# Patient Record
Sex: Female | Born: 1950 | Race: White | Hispanic: No | Smoking: Current every day smoker
Health system: Southern US, Community
[De-identification: ages and names within clinical notes are randomized; demographics above are authoritative.]

## PROBLEM LIST (undated history)

## (undated) DIAGNOSIS — I10 Essential (primary) hypertension: Secondary | ICD-10-CM

## (undated) DIAGNOSIS — E7800 Pure hypercholesterolemia, unspecified: Secondary | ICD-10-CM

## (undated) HISTORY — PX: AORTOGRAM: SHX6300

---

## 2003-01-26 ENCOUNTER — Emergency Department (HOSPITAL_COMMUNITY): Admission: EM | Admit: 2003-01-26 | Discharge: 2003-01-26 | Payer: Self-pay

## 2003-01-28 ENCOUNTER — Emergency Department (HOSPITAL_COMMUNITY): Admission: EM | Admit: 2003-01-28 | Discharge: 2003-01-28 | Payer: Self-pay | Admitting: Emergency Medicine

## 2003-02-25 ENCOUNTER — Emergency Department (HOSPITAL_COMMUNITY): Admission: EM | Admit: 2003-02-25 | Discharge: 2003-02-25 | Payer: Self-pay | Admitting: Emergency Medicine

## 2003-03-22 ENCOUNTER — Emergency Department (HOSPITAL_COMMUNITY): Admission: EM | Admit: 2003-03-22 | Discharge: 2003-03-22 | Payer: Self-pay | Admitting: Emergency Medicine

## 2003-03-31 ENCOUNTER — Encounter: Admission: RE | Admit: 2003-03-31 | Discharge: 2003-03-31 | Payer: Self-pay | Admitting: Neurological Surgery

## 2010-01-03 ENCOUNTER — Emergency Department (HOSPITAL_COMMUNITY): Admission: EM | Admit: 2010-01-03 | Discharge: 2010-01-03 | Payer: Self-pay | Admitting: Emergency Medicine

## 2011-02-21 DIAGNOSIS — I10 Essential (primary) hypertension: Secondary | ICD-10-CM

## 2011-02-21 HISTORY — DX: Essential (primary) hypertension: I10

## 2011-10-24 DIAGNOSIS — I2510 Atherosclerotic heart disease of native coronary artery without angina pectoris: Secondary | ICD-10-CM

## 2011-10-24 HISTORY — DX: Atherosclerotic heart disease of native coronary artery without angina pectoris: I25.10

## 2012-06-04 IMAGING — CR DG CHEST 2V
2 series · 2 of 2 positions shown · non-contrast
Comparison: None.

CLINICAL DATA: Chest congestion, cough, shortness of breath, long-
term smoking history

CHEST - 2 VIEW

[w chest pa]
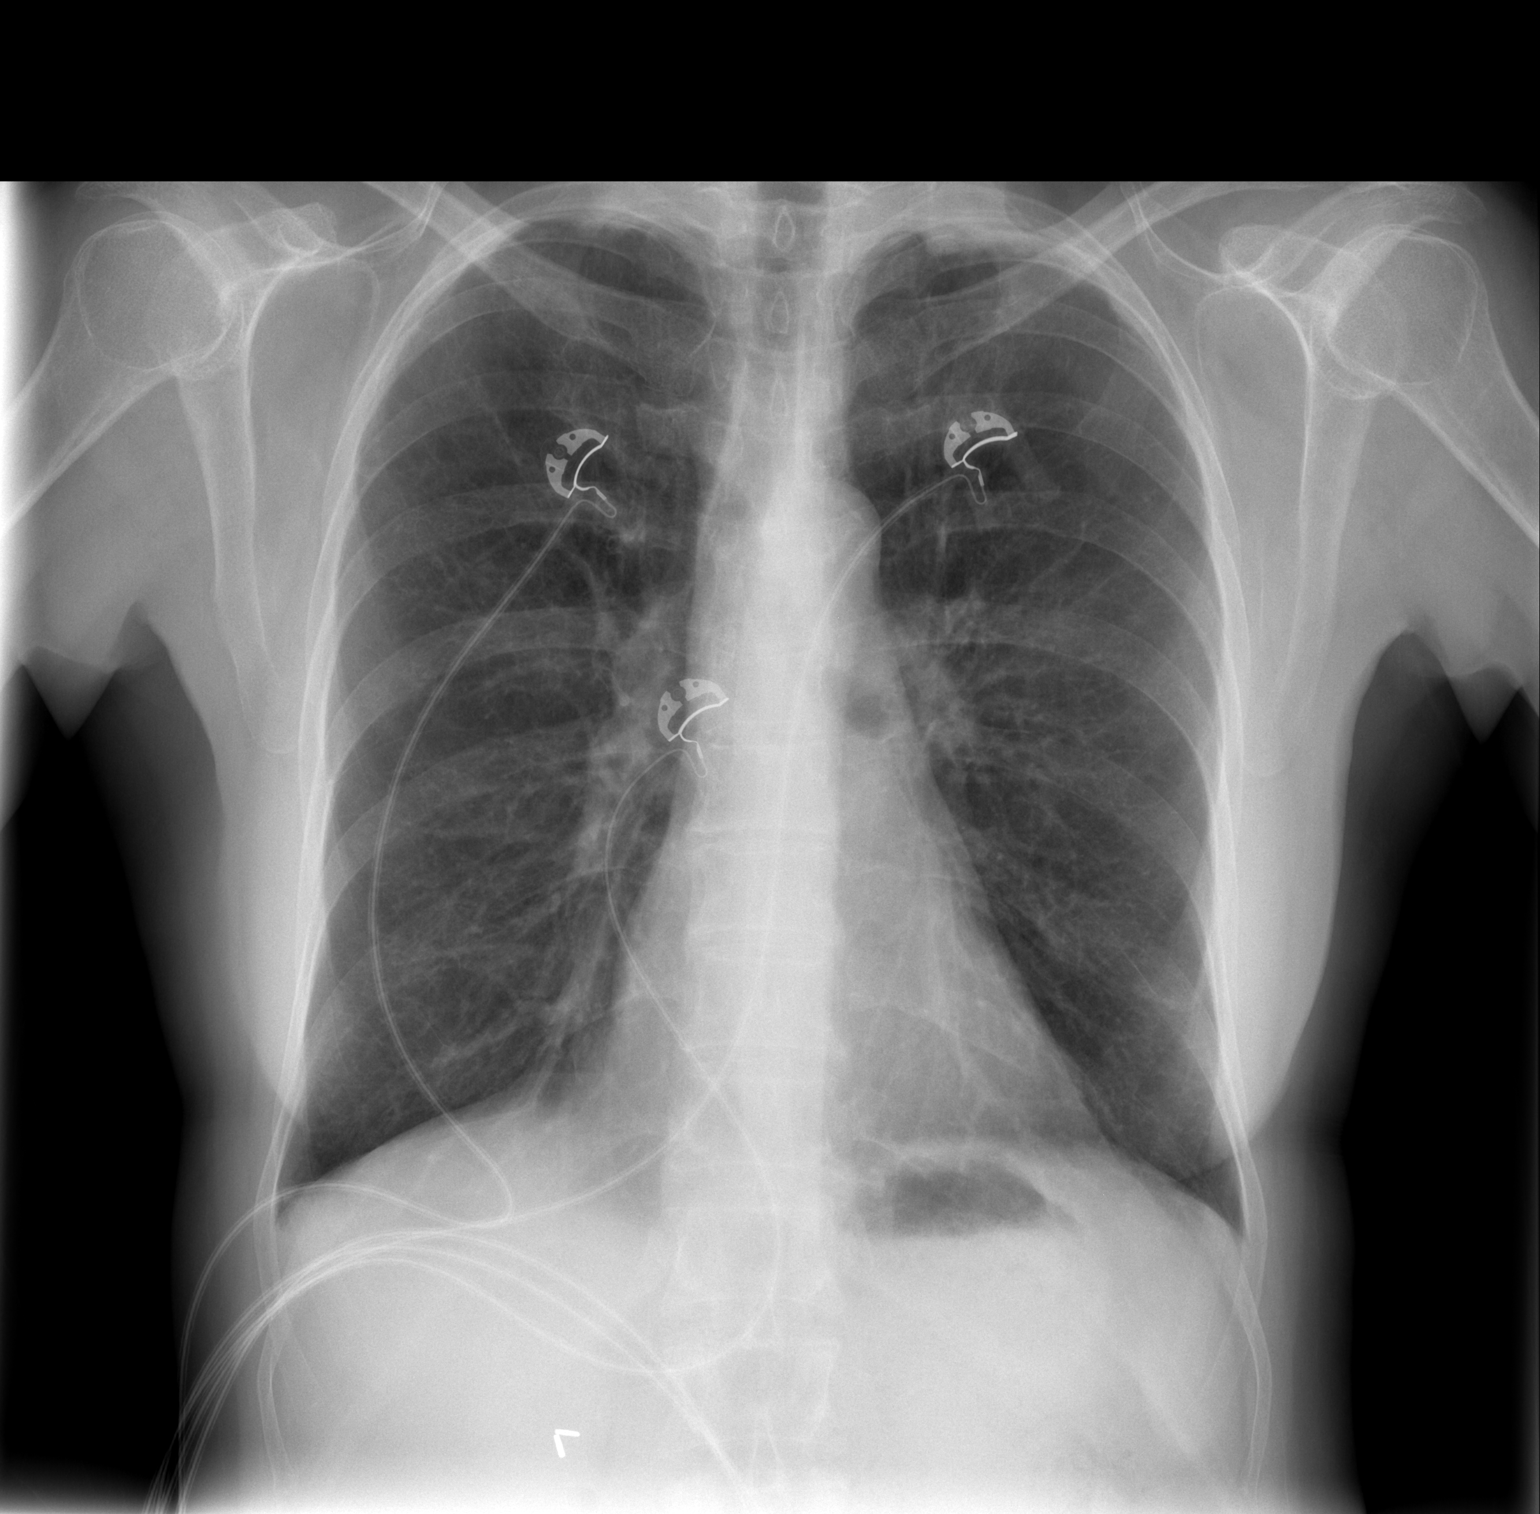

[w chest lat]
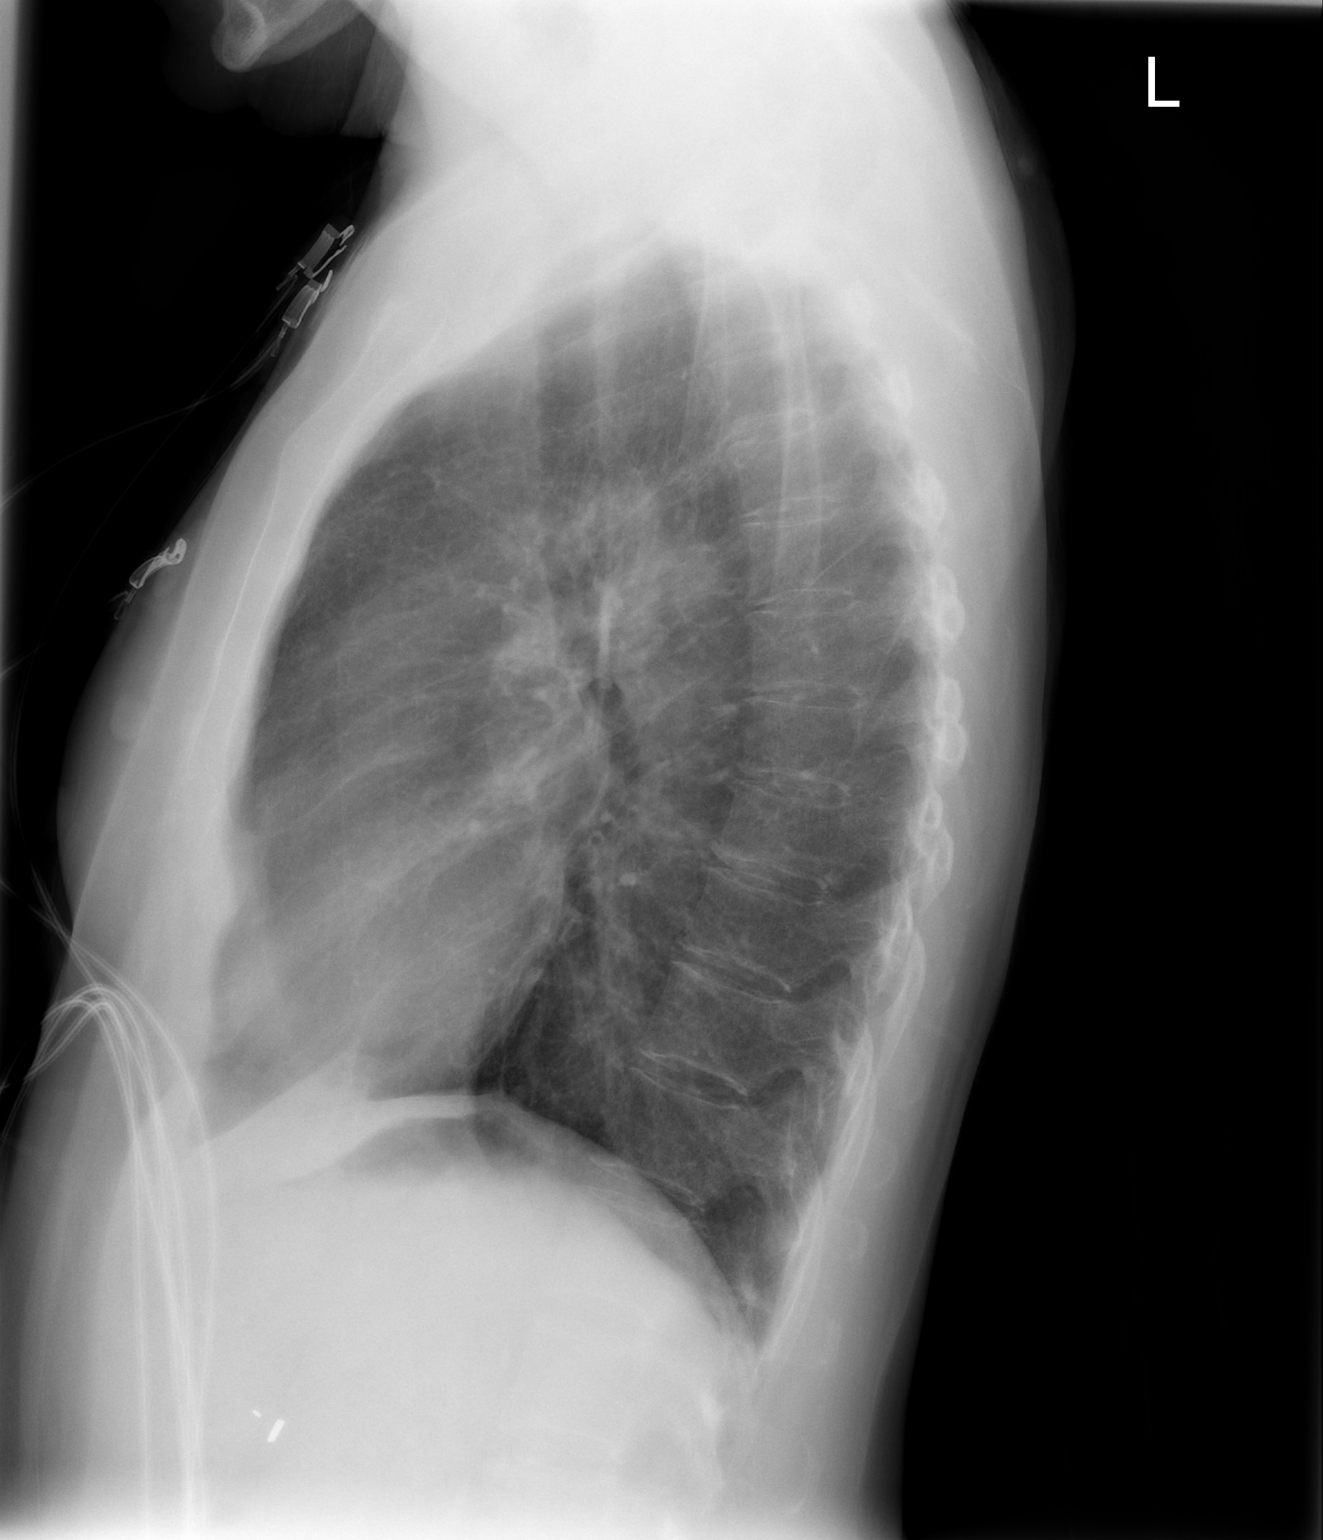

[2 of 2 positions shown; findings below may reference images not displayed]

FINDINGS: The lungs are clear but hyperaerated.  Mild biapical
pleural thickening is present.  There is mild peribronchial
thickening consistent with bronchitis.  The heart is within normal
limits in size.  No bony abnormality is seen.
IMPRESSION: Hyperaeration and peribronchial thickening consistent with COPD and
bronchitis.  No active process.

## 2012-07-23 DIAGNOSIS — K219 Gastro-esophageal reflux disease without esophagitis: Secondary | ICD-10-CM

## 2012-07-23 HISTORY — DX: Gastro-esophageal reflux disease without esophagitis: K21.9

## 2013-09-10 DIAGNOSIS — I5181 Takotsubo syndrome: Secondary | ICD-10-CM | POA: Insufficient documentation

## 2013-09-10 DIAGNOSIS — F17219 Nicotine dependence, cigarettes, with unspecified nicotine-induced disorders: Secondary | ICD-10-CM | POA: Insufficient documentation

## 2015-06-05 DIAGNOSIS — J449 Chronic obstructive pulmonary disease, unspecified: Secondary | ICD-10-CM

## 2015-06-05 HISTORY — DX: Chronic obstructive pulmonary disease, unspecified: J44.9

## 2021-09-15 ENCOUNTER — Other Ambulatory Visit: Payer: Self-pay

## 2021-09-15 ENCOUNTER — Emergency Department (HOSPITAL_COMMUNITY): Payer: Medicare Other

## 2021-09-15 ENCOUNTER — Emergency Department (HOSPITAL_COMMUNITY)
Admission: EM | Admit: 2021-09-15 | Discharge: 2021-09-15 | Discharge status: Against Medical Advice | Payer: Medicare Other | Attending: Emergency Medicine | Admitting: Emergency Medicine

## 2021-09-15 DIAGNOSIS — R202 Paresthesia of skin: Secondary | ICD-10-CM | POA: Diagnosis not present

## 2021-09-15 DIAGNOSIS — R42 Dizziness and giddiness: Secondary | ICD-10-CM | POA: Insufficient documentation

## 2021-09-15 DIAGNOSIS — Z5321 Procedure and treatment not carried out due to patient leaving prior to being seen by health care provider: Secondary | ICD-10-CM | POA: Insufficient documentation

## 2021-09-15 DIAGNOSIS — R55 Syncope and collapse: Secondary | ICD-10-CM | POA: Diagnosis not present

## 2021-09-15 DIAGNOSIS — R200 Anesthesia of skin: Secondary | ICD-10-CM | POA: Insufficient documentation

## 2021-09-15 LAB — COMPREHENSIVE METABOLIC PANEL
ALT: 15 U/L (ref 0–44)
AST: 17 U/L (ref 15–41)
Albumin: 3.4 g/dL — ABNORMAL LOW (ref 3.5–5.0)
Alkaline Phosphatase: 75 U/L (ref 38–126)
Anion gap: 6 (ref 5–15)
BUN: 13 mg/dL (ref 8–23)
CO2: 28 mmol/L (ref 22–32)
Calcium: 9.2 mg/dL (ref 8.9–10.3)
Chloride: 107 mmol/L (ref 98–111)
Creatinine, Ser: 0.88 mg/dL (ref 0.44–1.00)
GFR, Estimated: >60 mL/min (ref >60)
Glucose, Bld: 108 mg/dL — ABNORMAL HIGH (ref 70–99)
Potassium: 4.3 mmol/L (ref 3.5–5.1)
Sodium: 141 mmol/L (ref 135–145)
Total Bilirubin: 0.3 mg/dL (ref 0.3–1.2)
Total Protein: 7.1 g/dL (ref 6.5–8.1)

## 2021-09-15 LAB — CBC WITH DIFFERENTIAL/PLATELET
Abs Immature Granulocytes: 0.01 10*3/uL (ref 0.00–0.07)
Basophils Absolute: 0.1 10*3/uL (ref 0.0–0.1)
Basophils Relative: 1 %
Eosinophils Absolute: 0.3 10*3/uL (ref 0.0–0.5)
Eosinophils Relative: 4 %
HCT: 40.4 % (ref 36.0–46.0)
Hemoglobin: 13.4 g/dL (ref 12.0–15.0)
Immature Granulocytes: 0 %
Lymphocytes Relative: 44 %
Lymphs Abs: 3.8 10*3/uL (ref 0.7–4.0)
MCH: 30.2 pg (ref 26.0–34.0)
MCHC: 33.2 g/dL (ref 30.0–36.0)
MCV: 91.2 fL (ref 80.0–100.0)
Monocytes Absolute: 0.6 10*3/uL (ref 0.1–1.0)
Monocytes Relative: 7 %
Neutro Abs: 3.8 10*3/uL (ref 1.7–7.7)
Neutrophils Relative %: 44 %
Platelets: 297 10*3/uL (ref 150–400)
RBC: 4.43 MIL/uL (ref 3.87–5.11)
RDW: 12.8 % (ref 11.5–15.5)
WBC: 8.5 10*3/uL (ref 4.0–10.5)
nRBC: 0 % (ref 0.0–0.2)

## 2021-09-15 LAB — TROPONIN I (HIGH SENSITIVITY): Troponin I (High Sensitivity): 4 ng/L (ref <18)

## 2021-09-15 NOTE — ED Notes (Signed)
Patient states she is leaving d/t wait time 

## 2021-09-15 NOTE — ED Triage Notes (Signed)
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  She also describes tingling around her mouth.  NO chest pain, SOB or other symptoms. ?

## 2021-09-15 NOTE — ED Provider Triage Note (Signed)
Emergency Medicine Provider Triage Evaluation Note ? ?Rachel Ewing , a 71 y.o. female  was evaluated in triage.  Pt complains of dizziness.  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  Patient states that she felt the room was spinning.  Patient had to grab onto her fianc? for stability.  Patient states that during this episode she developed numbness around her mouth.  Dizziness and numbness lasted for approximately 10 minutes and then resolved.  Patient reports history of vertigo and states that this dizziness felt similar to previous episodes. ? ?Patient denies any chest pain, shortness of breath, lightheadedness, syncope, visual disturbance, dysarthria, facial asymmetry. ? ?Review of Systems  ?Positive: Dizziness, perioral numbness ?Negative: See above ? ?Physical Exam  ?BP (!) 153/66   Pulse 80   Temp 98.4 ?F (36.9 ?C) (Oral)   Resp 18   Ht 5\' 2"  (1.575 m)   Wt 64.4 kg   SpO2 100%   BMI 25.97 kg/m?  ?Gen:   Awake, no distress   ?Resp:  Normal effort, to auscultation bilaterally ?MSK:   Moves extremities without difficulty; swelling or tenderness of bilateral lower extremities ?Other:  +2 radial pulse bilaterally.  Systolic murmur noted. ? ?Medical Decision Making  ?Medically screening exam initiated at 7:14 PM.  Appropriate orders placed.  Rachel Ewing was informed that the remainder of the evaluation will be completed by another provider, this initial triage assessment does not replace that evaluation, and the importance of remaining in the ED until their evaluation is complete. ? ?Patient denies any known history of systolic murmur.  Due to reports of dizziness will obtain CMP, CBC, EKG, and troponin. ?  ?Rondel Baton, PA-C ?09/15/21 1916 ? ?

## 2022-01-08 ENCOUNTER — Encounter (HOSPITAL_COMMUNITY): Payer: Self-pay

## 2022-01-08 ENCOUNTER — Other Ambulatory Visit: Payer: Self-pay

## 2022-01-08 ENCOUNTER — Emergency Department (HOSPITAL_COMMUNITY): Payer: Medicare Other

## 2022-01-08 ENCOUNTER — Emergency Department (HOSPITAL_COMMUNITY)
Admission: EM | Admit: 2022-01-08 | Discharge: 2022-01-08 | Disposition: A | Discharge status: Home / Self Care | Payer: Medicare Other | Attending: Emergency Medicine | Admitting: Emergency Medicine

## 2022-01-08 ENCOUNTER — Ambulatory Visit
Admission: EM | Admit: 2022-01-08 | Discharge: 2022-01-08 | Disposition: A | Discharge status: Other Acute Inpatient Hospital | Payer: Medicare Other

## 2022-01-08 DIAGNOSIS — S8012XA Contusion of left lower leg, initial encounter: Secondary | ICD-10-CM

## 2022-01-08 DIAGNOSIS — M25552 Pain in left hip: Secondary | ICD-10-CM | POA: Diagnosis not present

## 2022-01-08 DIAGNOSIS — S8992XA Unspecified injury of left lower leg, initial encounter: Secondary | ICD-10-CM | POA: Diagnosis present

## 2022-01-08 DIAGNOSIS — W108XXA Fall (on) (from) other stairs and steps, initial encounter: Secondary | ICD-10-CM | POA: Insufficient documentation

## 2022-01-08 HISTORY — DX: Essential (primary) hypertension: I10

## 2022-01-08 HISTORY — DX: Pure hypercholesterolemia, unspecified: E78.00

## 2022-01-08 LAB — CBC WITH DIFFERENTIAL/PLATELET
Abs Immature Granulocytes: 0.01 10*3/uL (ref 0.00–0.07)
Basophils Absolute: 0.1 10*3/uL (ref 0.0–0.1)
Basophils Relative: 1 %
Eosinophils Absolute: 0.3 10*3/uL (ref 0.0–0.5)
Eosinophils Relative: 4 %
HCT: 40.1 % (ref 36.0–46.0)
Hemoglobin: 12.8 g/dL (ref 12.0–15.0)
Immature Granulocytes: 0 %
Lymphocytes Relative: 44 %
Lymphs Abs: 3.5 10*3/uL (ref 0.7–4.0)
MCH: 29.2 pg (ref 26.0–34.0)
MCHC: 31.9 g/dL (ref 30.0–36.0)
MCV: 91.3 fL (ref 80.0–100.0)
Monocytes Absolute: 0.5 10*3/uL (ref 0.1–1.0)
Monocytes Relative: 7 %
Neutro Abs: 3.4 10*3/uL (ref 1.7–7.7)
Neutrophils Relative %: 44 %
Platelets: 225 10*3/uL (ref 150–400)
RBC: 4.39 MIL/uL (ref 3.87–5.11)
RDW: 13.2 % (ref 11.5–15.5)
WBC: 7.9 10*3/uL (ref 4.0–10.5)
nRBC: 0 % (ref 0.0–0.2)

## 2022-01-08 LAB — BASIC METABOLIC PANEL
Anion gap: 6 (ref 5–15)
BUN: 14 mg/dL (ref 8–23)
CO2: 25 mmol/L (ref 22–32)
Calcium: 9.4 mg/dL (ref 8.9–10.3)
Chloride: 108 mmol/L (ref 98–111)
Creatinine, Ser: 0.84 mg/dL (ref 0.44–1.00)
GFR, Estimated: >60 mL/min (ref >60)
Glucose, Bld: 90 mg/dL (ref 70–99)
Potassium: 4.1 mmol/L (ref 3.5–5.1)
Sodium: 139 mmol/L (ref 135–145)

## 2022-01-08 LAB — PROTIME-INR
INR: 1 (ref 0.8–1.2)
Prothrombin Time: 13 seconds (ref 11.4–15.2)

## 2022-01-08 LAB — APTT: aPTT: 27 seconds (ref 24–36)

## 2022-01-08 MED ORDER — SODIUM CHLORIDE (PF) 0.9 % IJ SOLN
INTRAMUSCULAR | Status: DC
Start: 2022-01-08 — End: 2022-01-09
  Filled 2022-01-08: qty 50

## 2022-01-08 MED ORDER — IOHEXOL 350 MG/ML SOLN
125.0000 mL | Freq: Once | INTRAVENOUS | Status: AC | PRN
  Administered 2022-01-08: 100 mL via INTRAVENOUS

## 2022-01-08 NOTE — ED Provider Notes (Addendum)
[facility]-EMERGENCY DEPT Provider Note   CSN: [id number] Arrival date & time: 01/08/22  1449     History  Chief Complaint  Patient presents with   Leg Pain   Hip Pain    Rachel Ewing is a 71 y.o. female.  71 year old female presents with numbness and tingling to her left foot which began after she stepped off a step 2 days ago.  States she feels she twisted her ankle.  A month ago patient had bilateral iliac artery stents along with distal aorta stent.  States that her foot feels cooler to her.  Complains of sharp pain to her left hip that is worse ambulation.  Has been using over-the-counter medications without relief       Home Medications Prior to Admission medications   Not on File      Allergies    Patient has no known allergies.    Review of Systems   Review of Systems  All other systems reviewed and are negative.   Physical Exam Updated Vital Signs BP (!) 163/81   Pulse 65   Temp 98.2 F (36.8 C)   Resp 18   Ht 1.575 m (5\' 2" )   Wt 63.5 kg   SpO2 99%   BMI 25.61 kg/m  Physical Exam Vitals and nursing note reviewed.  Constitutional:      General: She is not in acute distress.    Appearance: Normal appearance. She is well-developed. She is not toxic-appearing.  HENT:     Head: Normocephalic and atraumatic.  Eyes:     General: Lids are normal.     Conjunctiva/sclera: Conjunctivae normal.     Pupils: Pupils are equal, round, and reactive to light.  Neck:     Thyroid: No thyroid mass.     Trachea: No tracheal deviation.  Cardiovascular:     Rate and Rhythm: Normal rate and regular rhythm.     Heart sounds: Normal heart sounds. No murmur heard.    No gallop.  Pulmonary:     Effort: Pulmonary effort is normal. No respiratory distress.     Breath sounds: Normal breath sounds. No stridor. No decreased breath sounds, wheezing, rhonchi or rales.  Abdominal:     General: There is no distension.     Palpations: Abdomen is soft.      Tenderness: There is no abdominal tenderness. There is no rebound.  Musculoskeletal:        General: No tenderness. Normal range of motion.     Cervical back: Normal range of motion and neck supple.       Legs:  Skin:    General: Skin is warm and dry.     Findings: No abrasion or rash.  Neurological:     Mental Status: She is alert and oriented to person, place, and time. Mental status is at baseline.     GCS: GCS eye subscore is 4. GCS verbal subscore is 5. GCS motor subscore is 6.     Cranial Nerves: No cranial nerve deficit.     Sensory: No sensory deficit.     Motor: Motor function is intact.  Psychiatric:        Attention and Perception: Attention normal.        Speech: Speech normal.        Behavior: Behavior normal.     ED Results / Procedures / Treatments   Labs (all labs ordered are listed, but only abnormal results are displayed) Labs Reviewed  BASIC METABOLIC  PANEL  CBC WITH DIFFERENTIAL/PLATELET  PROTIME-INR  APTT    EKG None  Radiology DG Hip Unilat W or Wo Pelvis 2-3 Views Left  Result Date: 01/08/2022 CLINICAL DATA:  Fall.  Pain from hip to ankle. EXAM: DG HIP (WITH OR WITHOUT PELVIS) 2-3V LEFT COMPARISON:  None Available. FINDINGS: No acute fracture or dislocation. Mild left hip osteoarthritis with acetabular spurring. Intact pubic rami. No is a below erosions or evidence of avascular necrosis. Intact bony pelvis. Pubic symphysis and sacroiliac joints are congruent. IMPRESSION: Mild left hip osteoarthritis. Electronically Signed   By: Narda Rutherford M.D.   On: 01/08/2022 16:27   DG Femur 1 View Left  Result Date: 01/08/2022 CLINICAL DATA:  Fall.  Pain from hip to ankle. EXAM: LEFT FEMUR 1 VIEW COMPARISON:  None Available. FINDINGS: Divided AP views of the left femur. The cortical margins are intact. No fracture is seen. Normal hip and knee alignment. No evidence of focal bone lesion or bone destruction on single view. IMPRESSION: Unremarkable frontal  views of the left femur. As clinically indicated, recommend completion views. Electronically Signed   By: Narda Rutherford M.D.   On: 01/08/2022 16:26   DG Ankle Complete Left  Result Date: 01/08/2022 CLINICAL DATA:  Fall. Patient reports twisting ankle a few days ago. EXAM: LEFT ANKLE COMPLETE - 3+ VIEW COMPARISON:  None Available. FINDINGS: There is no evidence of fracture, dislocation, or joint effusion. Ankle mortise is preserved. There is no evidence of arthropathy or other focal bone abnormality. Soft tissues are unremarkable. IMPRESSION: Negative radiographs of the left ankle. Electronically Signed   By: Narda Rutherford M.D.   On: 01/08/2022 16:25   DG Tibia/Fibula Left  Result Date: 01/08/2022 CLINICAL DATA:  Fall EXAM: LEFT TIBIA AND FIBULA - 2 VIEW COMPARISON:  None Available. FINDINGS: There is no evidence of fracture or other focal bone lesions. Soft tissues are unremarkable. IMPRESSION: No fracture or dislocation of the left tibia or fibula. Electronically Signed   By: Jearld Lesch M.D.   On: 01/08/2022 16:24    Procedures Procedures    Medications Ordered in ED Medications - No data to display  ED Course/ Medical Decision Making/ A&P                           Medical Decision Making Amount and/or Complexity of Data Reviewed Radiology: ordered.  Risk Prescription drug management.  Laboratory studies here are reassuring.  BMP and CBC without significant abnormality. Patient presented with leg pain after a fall several days ago.  Concern for possible bony injury and x-rays of her entire left lower extremity were negative for interpretation.  Patient had slight discoloration of her left big toe.  Slightly cool to the touch.  Concern for possible vascular involvement.  CT angio of her lower extremity with vascular runoff did not show any evidence of acute occlusion per my review and interpretation.  I did discuss the case with Dr. Lenell Antu, vascular surgeon on-call, who reviewed  the films as well.  He agreed that there was no acute process.    Plan will be for patient to follow-up with her vascular surgeon Patient has good posterior tibial pulses by Doppler flow       Final Clinical Impression(s) / ED Diagnoses Final diagnoses:  None    Rx / DC Orders ED Discharge Orders     None         Lorre Nick, MD 01/08/22 1927  Lorre Nick, MD 01/08/22 1932

## 2022-01-08 NOTE — ED Notes (Signed)
Patient transported to CT 

## 2022-01-08 NOTE — ED Notes (Signed)
Patient is being discharged from the Urgent Care and sent to the Emergency Department via Personal Vehicle with family . Per [facility] PA-C, patient is in need of higher level of care due to Recent Aorta Surgery and Changes in  LFT lower extremity. Patient is aware and verbalizes understanding of plan of care.  Vitals:   01/08/22 1349 01/08/22 1353  BP: 126/76   Pulse: 84   Resp:  20  Temp: 98 F (36.7 C)   SpO2: 97%

## 2022-01-08 NOTE — ED Triage Notes (Signed)
Pt states she twisted her left ankle a couple days ago when she was stepping off a trailer. PT had an aortogram last month. Pt c/o pain from her left hip all the way down to her ankle. Patient's left great toe is blue in color, unable to palpate a pedal pulse in triage.

## 2022-01-08 NOTE — Discharge Instructions (Signed)
Follow-up with your vascular surgeon next week.  Return here for any discoloration of your left foot, severe pain on proportion, or any other problems

## 2022-01-08 NOTE — ED Notes (Signed)
Dr. Freida Busman informed that this RN was unable to detect a pedal pulse with doppler.

## 2022-01-08 NOTE — ED Provider Triage Note (Signed)
Emergency Medicine Provider Triage Evaluation Note  Rachel Ewing , a 71 y.o. female  was evaluated in triage.  Pt complains of left leg and hip pain onset several days. Pt has a history of PVD and recently had an aortogram on 12/08/2021. Pt twisted her left ankle several days ago when stepping off a trailer. Has associated left hip pain, left thigh pain, left lower leg pain, left ankle pain. Denies hitting her head or LOC. On xarelto.   Review of Systems  Positive: As per HPI Negative:   Physical Exam  Ht 5\' 2"  (1.575 m)   Wt 63.5 kg   BMI 25.61 kg/m  Gen:   Awake, no distress   Resp:  Normal effort  MSK:   Moves extremities without difficulty  Other:  Unable to palpate DP pulse. Cyanosis noted to left great toe. TTP noted to lateral aspect of left tib/fib. No TTP noted to left knee with palpation. Able to flex and extend against resistance. TTP noted to anterolateral left thigh and anterior hip. No spinal TTP.   Medical Decision Making  Medically screening exam initiated at 3:42 PM.  Appropriate orders placed.  Rachel Ewing was informed that the remainder of the evaluation will be completed by another provider, this initial triage assessment does not replace that evaluation, and the importance of remaining in the ED until their evaluation is complete.  3:47 PM - Discussed with RN that patient is in need of a room. RN aware and working on room placement.    Kaivon Livesey A, PA-C 01/08/22 1551

## 2022-01-12 NOTE — ED Provider Notes (Signed)
Pt sent to ED from triage, not seen by myself   Ward, Tylene Fantasia, PA-C 01/12/22 1433

## 2022-03-30 ENCOUNTER — Other Ambulatory Visit: Payer: Self-pay

## 2022-03-30 ENCOUNTER — Emergency Department (HOSPITAL_COMMUNITY)
Admission: EM | Admit: 2022-03-30 | Discharge: 2022-03-31 | Disposition: A | Discharge status: Other Acute Inpatient Hospital | Payer: Medicare Other | Attending: Emergency Medicine | Admitting: Emergency Medicine

## 2022-03-30 ENCOUNTER — Emergency Department (HOSPITAL_COMMUNITY): Payer: Medicare Other

## 2022-03-30 ENCOUNTER — Encounter (HOSPITAL_COMMUNITY): Payer: Self-pay

## 2022-03-30 DIAGNOSIS — J449 Chronic obstructive pulmonary disease, unspecified: Secondary | ICD-10-CM | POA: Insufficient documentation

## 2022-03-30 DIAGNOSIS — F17200 Nicotine dependence, unspecified, uncomplicated: Secondary | ICD-10-CM | POA: Insufficient documentation

## 2022-03-30 DIAGNOSIS — I7090 Unspecified atherosclerosis: Secondary | ICD-10-CM | POA: Insufficient documentation

## 2022-03-30 DIAGNOSIS — Z7902 Long term (current) use of antithrombotics/antiplatelets: Secondary | ICD-10-CM | POA: Insufficient documentation

## 2022-03-30 DIAGNOSIS — M79604 Pain in right leg: Secondary | ICD-10-CM | POA: Diagnosis present

## 2022-03-30 DIAGNOSIS — Z7901 Long term (current) use of anticoagulants: Secondary | ICD-10-CM | POA: Diagnosis not present

## 2022-03-30 LAB — I-STAT CHEM 8, ED
BUN: 20 mg/dL (ref 8–23)
Calcium, Ion: 1.26 mmol/L (ref 1.15–1.40)
Chloride: 106 mmol/L (ref 98–111)
Creatinine, Ser: 0.7 mg/dL (ref 0.44–1.00)
Glucose, Bld: 122 mg/dL — ABNORMAL HIGH (ref 70–99)
HCT: 40 % (ref 36.0–46.0)
Hemoglobin: 13.6 g/dL (ref 12.0–15.0)
Potassium: 4.4 mmol/L (ref 3.5–5.1)
Sodium: 141 mmol/L (ref 135–145)
TCO2: 27 mmol/L (ref 22–32)

## 2022-03-30 LAB — BASIC METABOLIC PANEL
Anion gap: 7 (ref 5–15)
BUN: 19 mg/dL (ref 8–23)
CO2: 28 mmol/L (ref 22–32)
Calcium: 9.7 mg/dL (ref 8.9–10.3)
Chloride: 108 mmol/L (ref 98–111)
Creatinine, Ser: 0.79 mg/dL (ref 0.44–1.00)
GFR, Estimated: >60 mL/min (ref >60)
Glucose, Bld: 131 mg/dL — ABNORMAL HIGH (ref 70–99)
Potassium: 5 mmol/L (ref 3.5–5.1)
Sodium: 143 mmol/L (ref 135–145)

## 2022-03-30 LAB — CBC WITH DIFFERENTIAL/PLATELET
Abs Immature Granulocytes: 0.01 10*3/uL (ref 0.00–0.07)
Basophils Absolute: 0.1 10*3/uL (ref 0.0–0.1)
Basophils Relative: 1 %
Eosinophils Absolute: 0.3 10*3/uL (ref 0.0–0.5)
Eosinophils Relative: 4 %
HCT: 41.2 % (ref 36.0–46.0)
Hemoglobin: 13.2 g/dL (ref 12.0–15.0)
Immature Granulocytes: 0 %
Lymphocytes Relative: 39 %
Lymphs Abs: 2.9 10*3/uL (ref 0.7–4.0)
MCH: 29.5 pg (ref 26.0–34.0)
MCHC: 32 g/dL (ref 30.0–36.0)
MCV: 92 fL (ref 80.0–100.0)
Monocytes Absolute: 0.5 10*3/uL (ref 0.1–1.0)
Monocytes Relative: 7 %
Neutro Abs: 3.7 10*3/uL (ref 1.7–7.7)
Neutrophils Relative %: 49 %
Platelets: 225 10*3/uL (ref 150–400)
RBC: 4.48 MIL/uL (ref 3.87–5.11)
RDW: 13.2 % (ref 11.5–15.5)
WBC: 7.4 10*3/uL (ref 4.0–10.5)
nRBC: 0 % (ref 0.0–0.2)

## 2022-03-30 LAB — HEPARIN LEVEL (UNFRACTIONATED): Heparin Unfractionated: <0.1 IU/mL — ABNORMAL LOW (ref 0.30–0.70)

## 2022-03-30 LAB — APTT: aPTT: 28 seconds (ref 24–36)

## 2022-03-30 MED ORDER — SODIUM CHLORIDE (PF) 0.9 % IJ SOLN
INTRAMUSCULAR | Status: AC
  Filled 2022-03-30: qty 50

## 2022-03-30 MED ORDER — HEPARIN (PORCINE) 25000 UT/250ML-% IV SOLN
1000.0000 [IU]/h | INTRAVENOUS | Status: DC
  Administered 2022-03-30: 1000 [IU]/h via INTRAVENOUS
  Filled 2022-03-30: qty 250

## 2022-03-30 MED ORDER — HEPARIN BOLUS VIA INFUSION
2000.0000 [IU] | Freq: Once | INTRAVENOUS | Status: AC
  Administered 2022-03-30: 2000 [IU] via INTRAVENOUS
  Filled 2022-03-30: qty 2000

## 2022-03-30 MED ORDER — IOHEXOL 350 MG/ML SOLN
100.0000 mL | Freq: Once | INTRAVENOUS | Status: AC | PRN
  Administered 2022-03-30: 100 mL via INTRAVENOUS

## 2022-03-30 NOTE — ED Provider Notes (Signed)
[facility]-EMERGENCY DEPT Provider Note   CSN: [id number] Arrival date & time: 03/30/22  1254     History  Chief Complaint  Patient presents with   Leg Pain    Rachel Ewing is a 71 y.o. female.  71 year old female with complaint of right leg pain and numbness. Reports stents placed in July, occlusion of her left iliac stent in July, treated surgically. Followed up in the office yesterday for recheck and had flow in the right leg yesterday. States she had left leg pain briefly yesterday, today developed right leg pain and numbness which prompted her to come to the ER. Patient's vascular surgeon is with [facility] but she lives in [place] with her boyfriend.  Patient is a daily smoker. Is on Plavix and Xarelto.        Home Medications Prior to Admission medications   Medication Sig Start Date End Date Taking? Authorizing Provider  albuterol (PROVENTIL) (2.5 MG/3ML) 0.083% nebulizer solution Take 2.5 mg by nebulization every 6 (six) hours as needed for wheezing or shortness of breath. 08/30/21  Yes [provider]  clopidogrel (PLAVIX) 75 MG tablet Take 75 mg by mouth daily. 01/28/22 01/28/23 Yes [provider]  FLUoxetine (PROZAC) 20 MG capsule Take 20 mg by mouth daily. 10/26/21  Yes [provider]  gabapentin (NEURONTIN) 300 MG capsule Take 300 mg by mouth daily as needed (pain). 10/26/21  Yes [provider]  LINZESS 72 MCG capsule Take 72 mcg by mouth every morning. 10/26/21  Yes [provider]  lisinopril (ZESTRIL) 10 MG tablet Take 10 mg by mouth daily. 11/08/21  Yes [provider]  nystatin cream (MYCOSTATIN) Apply 1 Application topically 2 (two) times daily. 03/29/22  Yes [provider]  rOPINIRole (REQUIP) 1 MG tablet Take 1 mg by mouth at bedtime. 11/08/21  Yes [provider]  rosuvastatin (CRESTOR) 20 MG tablet Take 20 mg by mouth at bedtime. 08/07/21  Yes [provider]   tiZANidine (ZANAFLEX) 4 MG tablet Take 4 mg by mouth at bedtime. 08/30/21  Yes [provider]  XARELTO 2.5 MG TABS tablet Take 2.5 mg by mouth in the morning. 08/07/21  Yes [provider]  HYDROcodone-acetaminophen (NORCO/VICODIN) 5-325 MG tablet Take 2 tablets by mouth 2 (two) times daily. Patient not taking: Reported on 03/30/2022 01/28/22   [provider]  TRELEGY ELLIPTA 100-62.5-25 MCG/ACT AEPB Inhale 1 puff into the lungs daily as needed (sob/wheezing). 08/07/21   [provider]      Allergies    Azithromycin, Codeine, Penicillins, Aspirin, Tramadol, Cyclobenzaprine, and Prednisone    Review of Systems   Review of Systems Negative except as per HPI Physical Exam Updated Vital Signs BP 100/71   Pulse 77   Temp 99 F (37.2 C)   Resp 17   Ht 5\' 4"  (1.626 m)   Wt 62.1 kg   SpO2 95%   BMI 23.52 kg/m  Physical Exam Vitals and nursing note reviewed.  Constitutional:      General: She is not in acute distress.    Appearance: She is well-developed. She is not diaphoretic.  HENT:     Head: Normocephalic and atraumatic.  Cardiovascular:     Rate and Rhythm: Normal rate and regular rhythm.     Heart sounds: Normal heart sounds.  Pulmonary:     Effort: Pulmonary effort is normal.     Breath sounds: Normal breath sounds.  Abdominal:     Palpations: Abdomen is  soft.     Tenderness: There is no abdominal tenderness.  Musculoskeletal:        General: Tenderness present.     Comments: Unable to palpate DP pulse on right foot.  Right foot is cool to touch.  Sensation intact, able to move toes.  Neurological:     Mental Status: She is alert and oriented to person, place, and time.  Psychiatric:        Behavior: Behavior normal.     ED Results / Procedures / Treatments   Labs (all labs ordered are listed, but only abnormal results are displayed) Labs Reviewed  BASIC METABOLIC PANEL - Abnormal; Notable for the following components:       Result Value   Glucose, Bld 131 (*)    All other components within normal limits  HEPARIN LEVEL (UNFRACTIONATED) - Abnormal; Notable for the following components:   Heparin Unfractionated <0.10 (*)    All other components within normal limits  I-STAT CHEM 8, ED - Abnormal; Notable for the following components:   Glucose, Bld 122 (*)    All other components within normal limits  CBC WITH DIFFERENTIAL/PLATELET  APTT  HEPARIN LEVEL (UNFRACTIONATED)  CBC    EKG None  Radiology CT Angio Aortobifemoral W and/or Wo Contrast  Result Date: 03/30/2022 CLINICAL DATA:  Right lower extremity pain with claudication symptoms. EXAM: CT ANGIOGRAPHY OF ABDOMINAL AORTA WITH ILIOFEMORAL RUNOFF TECHNIQUE: Multidetector CT imaging of the abdomen, pelvis and lower extremities was performed using the standard protocol during bolus administration of intravenous contrast. Multiplanar CT image reconstructions and MIPs were obtained to evaluate the vascular anatomy. RADIATION DOSE REDUCTION: This exam was performed according to the departmental dose-optimization program which includes automated exposure control, adjustment of the mA and/or kV according to patient size and/or use of iterative reconstruction technique. CONTRAST:  100mL OMNIPAQUE IOHEXOL 350 MG/ML SOLN COMPARISON:  CTA runoff-01/08/2022 FINDINGS: VASCULAR Aorta: Sequela of endovascular repair of infrarenal abdominal aorta. Interval thrombectomy of previously noted occluded left iliac gait with overlapping telescoping stent placement, now extending to the left external iliac artery. There has been interval occlusion of the right iliac gait with atretic reconstitution of the right external and internal iliac arteries via trans pelvic arterial collaterals as well as flow from the ipsilateral inferior epigastric and deep iliac circumflex arteries. There is a minimal amount of nonocclusive intimal wall thickening/mural thrombus within the aortic component of the  stent graft (representative image 62, series 5), unchanged and again not resulting in a hemodynamically significant stenosis. There is no definitive opacification of the otherwise excluded native abdominal aorta. No perivascular stranding. Celiac: There is a minimal amount of calcified atherosclerotic plaque involving the origin of the celiac artery, not resulting in a hemodynamically significant stenosis. Conventional branching pattern. SMA: Mild tortuosity involving the origin of the SMA, not resulting in a hemodynamically significant stenosis. The distal tributaries of the SMA appear widely patent without discrete lumen filling defect to suggest distal embolism. Renals: Solitary bilaterally; bilateral renal arteries are widely patent without hemodynamically significant stenosis. No vessel irregularity to suggest FMD. IMA: Occluded at its origin as a sequela of stent graft repair with early reconstitution via collateral supply from the SMA, unchanged. _________________________________________________________ RIGHT Lower Extremity Inflow: As above, there has been interval thrombosis of the right iliac gait of the EVAR, new compared to the 01/08/2022 examination. There is atretic reconstitution of the right external and internal iliac arteries via trans pelvic arterial collaterals as well as supply from the ipsilateral inferior  epigastric and deep iliac circumflex arteries. Outflow: Minimal amount of calcified atherosclerotic plaque involving the right common femoral artery, not resulting in hemodynamically significant stenosis. The right deep and superficial femoral arteries are of normal caliber and without hemodynamically significant narrowing. The right above and below-knee popliteal arteries are of normal caliber and without hemodynamically significant narrowing. Runoff: Three-vessel runoff to the right lower leg and foot. The right-sided dorsalis pedis artery is patent to the level of the forefoot. No discrete  lumen filling defects to suggest distal embolism. _________________________________________________________ LEFT Lower Extremity Inflow: As above, interval thrombectomy of the left iliac gait with placement of a telescoping stent, now extending into the proximal aspect of the left external iliac artery. The left iliac gait, as well as the left external iliac artery, are widely patent without hemodynamically significant narrowing. Interval thrombosis of the proximal and mid aspect of the left internal iliac artery with reconstitution distally via trans pelvic arterial collaterals. Outflow: Post presumed open patch repair of the distal aspect of the left common femoral artery. Minimal amount of mixed calcified and noncalcified atherosclerotic plaque involving the left common femoral artery, not resulting in a hemodynamically significant stenosis. The left deep and superficial femoral arteries are of normal caliber and widely patent without hemodynamically significant stenosis. The left above and below-knee popliteal arteries are of normal caliber and widely patent without hemodynamically significant narrowing. Runoff: Three-vessel runoff to the left lower leg and foot. Nonvisualization of the left dorsalis pedis artery. Veins: The IVC and pelvic venous systems appear patent on this arterial phase examination. Review of the MIP images confirms the above findings. _________________________________________________________ NON-VASCULAR Evaluation of the abdominal organs is limited to the arterial phase of enhancement. Lower chest: Limited visualization of the lower thorax demonstrates minimal right basilar subsegmental atelectasis. No discrete focal airspace opacities. No pleural effusion. Borderline cardiomegaly.  No pericardial effusion. Hepatobiliary: Normal hepatic contour. No discrete hepatic lesions. Post cholecystectomy. The common bile duct is mildly dilated measuring 1.1 cm in diameter (image 40, series 5),  similar to the 12/2021 examination and likely the sequela of biliary reservoir phenomena in the setting of post cholecystectomy state. No ascites. Pancreas: Normal appearance of the pancreas. Spleen: Normal appearance of the spleen. Adrenals/Urinary Tract: There is symmetric enhancement of the bilateral kidneys. No evidence of nephrolithiasis on this postcontrast examination. No discrete renal lesions. No urine obstruction or perinephric stranding. Normal appearance of the bilateral adrenal glands. Normal appearance of the urinary bladder given degree of distention. Stomach/Bowel: Moderate to large colonic stool burden without evidence of enteric obstruction. Normal appearance of the terminal ileum and retrocecal appendix. No hiatal hernia. No pneumoperitoneum, pneumatosis or portal venous gas. Lymphatic: No bulky retroperitoneal, mesenteric, pelvic or inguinal lymphadenopathy. Reproductive: Post hysterectomy.  No discrete adnexal lesions. Other: Presumed postoperative stranding within the left groin without definable/drainable fluid collection or hematoma. Musculoskeletal: No acute or aggressive osseous abnormalities. IMPRESSION: VASCULAR 1. Stable sequela of endovascular repair of the infrarenal abdominal aorta. 2. Interval occlusion the right iliac gait of the EVAR with reconstitution of the right external and internal iliac arteries via trans pelvic arterial collaterals and supply from the ipsilateral right inferior epigastric and deep iliac circumflex arteries. 3. Otherwise, wide patency of the outflow and runoff arterial vasculature of the right lower extremity. Three-vessel runoff to the right lower leg and foot with the dorsalis pedis artery appearing patent to the level of the forefoot. No discrete intraluminal filling defects to suggest distal embolism. 4. Interval thrombectomy and stent extension of  the left iliac gait without evidence of a residual or recurrent hemodynamically significant narrowing. 5.  Post presumed open patch repair of the distal aspect of the left superficial femoral artery without evidence of a residual or recurrent hemodynamically significant narrowing. Three-vessel runoff to the left lower leg, however a left-sided dorsalis pedis artery is not visualized. 6.  Aortic Atherosclerosis (ICD10-I70.0). NON-VASCULAR 1. No acute findings within the abdomen or pelvis with stable ancillary findings as detailed above. Critical Value/emergent results were called by telephone at the time of interpretation on 03/30/2022 at 3:47 pm to provider [facility] , who verbally acknowledged these results. Electronically Signed   By: Simonne Come M.D.   On: 03/30/2022 15:48    Procedures .Critical Care  Performed by: Jeannie Fend, PA-C Authorized by: Jeannie Fend, PA-C   Critical care provider statement:    Critical care time (minutes):  30   Critical care was time spent personally by me on the following activities:  Development of treatment plan with patient or surrogate, discussions with consultants, evaluation of patient's response to treatment, examination of patient, ordering and review of laboratory studies, ordering and review of radiographic studies, ordering and performing treatments and interventions, pulse oximetry, re-evaluation of patient's condition and review of old charts     Medications Ordered in ED Medications  heparin ADULT infusion 100 units/mL (25000 units/264mL) (1,000 Units/hr Intravenous Infusion Verify 03/30/22 2001)  iohexol (OMNIPAQUE) 350 MG/ML injection 100 mL (100 mLs Intravenous Contrast Given 03/30/22 1429)  sodium chloride (PF) 0.9 % injection (  Given by Other 03/30/22 2001)  heparin bolus via infusion 2,000 Units (2,000 Units Intravenous Bolus from Bag 03/30/22 1707)    ED Course/ Medical Decision Making/ A&P                           Medical Decision Making Amount and/or Complexity of Data Reviewed Labs: ordered.  Risk Prescription drug  management.   This patient presents to the ED for concern of right leg pain and numbness, this involves an extensive number of treatment options, and is a complaint that carries with it a high risk of complications and morbidity.  The differential diagnosis includes but not limited to occlusion of stent, neuro/vascular compromise    Co morbidities that complicate the patient evaluation  Smoker, hyperlipidemia, peripheral vascular disease, COPD, peripheral artery disease   Additional history obtained:  External records from outside source obtained and reviewed including recent note family medicine dated yesterday, visit for dysuria.  Prior visit to patient's vascular surgeon dated 02/11/2022 postop from left side occlusion   Lab Tests:  I Ordered, and personally interpreted labs.  The pertinent results include: I-STAT Chem-8 with normal renal function, proceed with CT with contrast.  Basic metabolic panel with glucose of 131, otherwise unremarkable.  PTT normal.  CBC within normal meds.   Imaging Studies ordered:  I ordered imaging studies including CT angio aorta bifemoral with contrast Occlusion of right iliac gair of the EVAR I agree with the radiologist interpretation    Consultations Obtained:  I requested consultation with the Dr. Lenell Antu with vascular surgery,  and discussed lab and imaging findings as well as pertinent plan - they recommend: heparin drip and transfer ER to ER for vascular evaluation. Discussed with Dr. Rush Landmark, ER attending, aware of transfer.  Was seen by Dr. Lenell Antu while awaiting transfer who was able to obtain flow by Doppler at bedside.  Relayed patient's request for transfer  to [facility] for care by her surgeon. Patient later requested treatment by her vascular surgeon with [facility]. Call to Dr. Joseph Pierini at [place] who accepts patient in transfer to [facility]. Unfortunately a bed is not available and transfer center is unable to say when a bed could become  available.   Problem List / ED Course / Critical interventions / Medication management  71 year old female presents with complaint of pain and numbness in her right leg onset today.  Recent occlusion of left leg stent, concern for same.  Sensation intact in right foot, able to move toes.  Unable to obtain DP pulse in triage, patient was sent for emergent CTA and found to have occlusion of her right leg stent.  Call to on-call vascular surgeon who recommends heparin and transfer to [facility].  Vascular surgery arrives at bedside, patient request transfer to [facility] to see her vascular surgeon.  Call to [facility], discussed with patient's vascular surgeon who accepts in transfer however bed is not available and unable to provide expected timeline.  Awaiting the ER, patient became frustrated, states that she has not eaten all day, contacted her boyfriend who provided a meal.  Shortly after, received call from [facility] who has a bed available for the patient.  Informed patient of same, recommend that she stop eating pending treatment plan when she arrives at [facility] [place] facility. I ordered medication including heparin for COVID stent Reevaluation of the patient after these medicines showed that the patient stayed the same I have reviewed the patients home medicines and have made adjustments as needed   Social Determinants of Health:  Smoker, lives locally with boyfriend, has specialty and primary care follow-up.   Test / Admission - Considered:  Admit for further management transfer to [facility] for treatment         Final Clinical Impression(s) / ED Diagnoses Final diagnoses:  Arterial occlusion    Rx / DC Orders ED Discharge Orders     None         Alden Hipp 03/30/22 2119    Benjiman Core, MD 03/31/22 [phone]

## 2022-03-30 NOTE — ED Notes (Signed)
Report given to [facility] ED charge RN. 

## 2022-03-30 NOTE — ED Triage Notes (Signed)
Pt presents with c/o right leg pain. Pt reports hx of multiple stents placed. Pt reports her left foot had some numbness yesterday lasting approx 30 minutes which eventually moved to her right leg.

## 2022-03-30 NOTE — ED Provider Notes (Signed)
3:29 PM Discussed with radiology given CTA to inquire about results. Patient has arterial occlusion. Patient brought straight back to room. Heparin started. Oncoming provider calling vascular surgery.     Mannie Stabile, PA-C 03/30/22 1530    LongArlyss Repress, MD 04/05/22 [phone]

## 2022-03-30 NOTE — ED Notes (Signed)
Vascular surgeon came to see pt prior to transport

## 2022-03-30 NOTE — Consult Note (Signed)
VASCULAR AND [facility]IN SPECIALISTS OF [place]  ASSESSMENT / PLAN: 71 y.o. female with thrombosed right common iliac artery stenting done at [facility]. Patient has a viable foot with intact motor and sensory function. She prefers transfer to [facility] to receive care from her primary surgeon - Dr. Luan Pulling. I think this is safe given her clinical findings. If [facility] is not able to accept the patient, I will be more than happy to care for her. Recommend initiating heparin infusion and continuing through transfer.   CHIEF COMPLAINT: right foot paresthesias  HISTORY OF PRESENT ILLNESS: Rachel Ewing is a 71 y.o. female with strong history of peripheral arterial disease.  She is a patient of Dr. Joseph Pierini at [facility], who is performed aortic and iliac stenting for aortoiliac occlusive disease.  In July 2023 she underwent aortic and common iliac artery stenting.  In September 2023 she developed worsening left leg pain.  Work-up found her left iliac stents to be occluded.  She underwent left iliac thrombectomy and left femoral endarterectomy.  She tolerated this well.  She was placed on aspirin, low-dose Xarelto, and Plavix therapy.  Unfortunately she continues to smoke.  Past Medical History:  Diagnosis Date   High cholesterol    Hypertension   COPD GERD CAD with MI PAD  Past Surgical History:  Procedure Laterality Date   AORTOGRAM    Cholecystectomy Hysterectomy Iliac stenting  Left femoral endarterectomy and iliac thrombectomy  History reviewed. No pertinent family history.  Social History   Socioeconomic History   Marital status: Single    Spouse name: Not on file   Number of children: Not on file   Years of education: Not on file   Highest education level: Not on file  Occupational History   Not on file  Tobacco Use   Smoking status: Every Day    Types: Cigarettes   Smokeless tobacco: Never  Substance and Sexual Activity   Alcohol use: Yes   Drug use: Never   Sexual  activity: Not on file  Other Topics Concern   Not on file  Social History Narrative   Not on file   Social Determinants of Health   Financial Resource Strain: Not on file  Food Insecurity: Not on file  Transportation Needs: Not on file  Physical Activity: Not on file  Stress: Not on file  Social Connections: Not on file  Intimate Partner Violence: Not on file    Allergies  Allergen Reactions   Azithromycin Itching   Codeine Nausea Only and Other (See Comments)    Causes stomach pain Causes stomach pain    Penicillins Other (See Comments) and Rash    Tolerates cephalosporins Cause stomach pain    Aspirin     Can Take 81 mg but not 325 mg. GI Upset (intolerance) Nausea Only   Tramadol Nausea Only   Cyclobenzaprine Nausea Only   Prednisone Nausea Only and Other (See Comments)    Low dose is ok Can only take 10mg  of prednisone- gives her shakes      Current Facility-Administered Medications  Medication Dose Route Frequency Provider Last Rate Last Admin   sodium chloride (PF) 0.9 % injection            Current Outpatient Medications  Medication Sig Dispense Refill   albuterol (PROVENTIL) (2.5 MG/3ML) 0.083% nebulizer solution Take 2.5 mg by nebulization every 6 (six) hours as needed for wheezing or shortness of breath.     clopidogrel (PLAVIX) 75 MG tablet Take 75 mg  by mouth daily.     FLUoxetine (PROZAC) 20 MG capsule Take 20 mg by mouth daily.     gabapentin (NEURONTIN) 300 MG capsule Take 300 mg by mouth daily as needed (pain).     LINZESS 72 MCG capsule Take 72 mcg by mouth every morning.     lisinopril (ZESTRIL) 10 MG tablet Take 10 mg by mouth daily.     nystatin cream (MYCOSTATIN) Apply 1 Application topically 2 (two) times daily.     rOPINIRole (REQUIP) 1 MG tablet Take 1 mg by mouth at bedtime.     rosuvastatin (CRESTOR) 20 MG tablet Take 20 mg by mouth at bedtime.     tiZANidine (ZANAFLEX) 4 MG tablet Take 4 mg by mouth at bedtime.     XARELTO 2.5 MG TABS  tablet Take 2.5 mg by mouth in the morning.     HYDROcodone-acetaminophen (NORCO/VICODIN) 5-325 MG tablet Take 2 tablets by mouth 2 (two) times daily. (Patient not taking: Reported on 03/30/2022)     TRELEGY ELLIPTA 100-62.5-25 MCG/ACT AEPB Inhale 1 puff into the lungs daily as needed (sob/wheezing).      PHYSICAL EXAM Vitals:   03/30/22 1304 03/30/22 1522  BP: (!) 174/147 123/79  Pulse: 93 70  Resp: 16 18  Temp: 98.2 F (36.8 C) 99 F (37.2 C)  TempSrc: Oral Oral  SpO2: 100% 97%  Weight: 62.1 kg   Height: 5\' 4"  (1.626 m)    Chronically ill-appearing woman in no acute distress Regular rate and rhythm Unlabored breathing Motor and sensory function in the right foot intact Monophasic Doppler flow in the dorsalis pedis artery on the right  PERTINENT LABORATORY AND RADIOLOGIC DATA  Most recent CBC    Latest Ref Rng & Units 03/30/2022    2:01 PM 03/30/2022    1:50 PM 01/08/2022    4:15 PM  CBC  WBC 4.0 - 10.5 K/uL  7.4  7.9   Hemoglobin 12.0 - 15.0 g/dL 01/10/2022  63.0  16.0   Hematocrit 36.0 - 46.0 % 40.0  41.2  40.1   Platelets 150 - 400 K/uL  225  225      Most recent CMP    Latest Ref Rng & Units 03/30/2022    2:01 PM 03/30/2022    1:50 PM 01/08/2022    4:15 PM  CMP  Glucose 70 - 99 mg/dL 01/10/2022  323  90   BUN 8 - 23 mg/dL 20  19  14    Creatinine 0.44 - 1.00 mg/dL 557   3.22   Sodium 135 - 145 mmol/L 141  143  139   Potassium 3.5 - 5.1 mmol/L 4.4  5.0  4.1   Chloride 98 - 111 mmol/L 106  108  108   CO2 22 - 32 mmol/L  28  25   Calcium 8.9 - 10.3 mg/dL  9.7  9.4    CT angiogram of the abdomen pelvis with runoff personally reviewed Complete endovascular reconstruction of the aorta and bifurcation Right common iliac stent is thrombosed Right hypogastric artery reconstitutes Right external iliac artery diminutive but reconstituted likely via retrograde flow from the hypogastric artery and epigastric artery Right lower extremity runoff appears preserved but  underfilled  0.25. 4.27, MD [facility] Phone Number: [phone] 03/30/2022 4:46 PM   Total time spent on preparing this encounter including chart review, data review, collecting history, examining the patient, coordinating care for this new patient, 45 minutes.  Portions of this report may have been transcribed using voice recognition software.  Every effort has been made to ensure accuracy; however, inadvertent computerized transcription errors may still be present.

## 2022-03-30 NOTE — ED Provider Triage Note (Signed)
Emergency Medicine Provider Triage Evaluation Note  Rachel Ewing , a 71 y.o. female  was evaluated in triage.  Pt complains of right leg pain associate with numbness/tingling.  Patient states pain started in her left lower extremity and then transition to her right lower extremity.  History of numerous stents.  Review of Systems  Positive: Numbness/tingling, pain Negative: fever  Physical Exam  BP (!) 174/147 (BP Location: Right Arm)   Pulse 93   Temp 98.2 F (36.8 C) (Oral)   Resp 16   Ht 5\' 4"  (1.626 m)   Wt 62.1 kg   SpO2 100%   BMI 23.52 kg/m  Gen:   Awake, no distress   Resp:  Normal effort  MSK:   Moves extremities without difficulty  Other:  Unable to palpate pedal pulses  Medical Decision Making  Medically screening exam initiated at 1:31 PM.  Appropriate orders placed.  Rachel Ewing was informed that the remainder of the evaluation will be completed by another provider, this initial triage assessment does not replace that evaluation, and the importance of remaining in the ED until their evaluation is complete.  Chem 8> CTA lower extremities to rule out arterial occlusion. 2:09 PM Called CT to get patient to scanner to rule out ischemic leg.    Rachel Baton, PA-C 03/30/22 1409

## 2022-03-30 NOTE — Progress Notes (Addendum)
ANTICOAGULATION CONSULT NOTE   Pharmacy Consult for heparin  Indication: PVD, occlusion of the right iliac gait  Allergies  Allergen Reactions   Azithromycin Itching   Codeine Nausea Only and Other (See Comments)    Causes stomach pain Causes stomach pain    Penicillins Other (See Comments) and Rash    Tolerates cephalosporins Cause stomach pain    Aspirin     Can Take 81 mg but not 325 mg. GI Upset   Tramadol Nausea Only   Cyclobenzaprine Nausea Only   Prednisone Nausea Only and Other (See Comments)    Low dose is ok Can only take 10mg  of prednisone- gives her shakes      Patient Measurements: Height: 5\' 4"  (162.6 cm) Weight: 62.1 kg (137 lb) IBW/kg (Calculated) : 54.7 Heparin Dosing Weight: 62 kg  Vital Signs: Temp: 99 F (37.2 C) (11/08 1522) Temp Source: Oral (11/08 1522) BP: 123/79 (11/08 1522) Pulse Rate: 70 (11/08 1522)  Labs: Recent Labs    03/30/22 1350 03/30/22 1401  HGB 13.2 13.6  HCT 41.2 40.0  PLT 225  --   CREATININE 0.79 0.70    Estimated Creatinine Clearance: 57.3 mL/min (by C-G formula based on SCr of 0.7 mg/dL).   Medications:  - pt reported she takes xarelto 2.5 mg daily (typical dose is 2.5 mg bid) at home with last dose taken 3-4 days ago PTA.  She said sometime she forgets to take her medications. - Patient informed me that she gets this medication at [facility]. I called her [facility] to clarify dose and they said they have no record of them filling xarelto for her.  Assessment: Patient's a 71 y.o F with hx PVD (s/p thrombectomy and iliac stent) who presented to the ED on 03/30/22 with c/o right leg pain.  CT angio aortobifemorol showed "interval occlusion the right iliac gait of the EVAR."  Pharmacy has been consulted to start heparin drip for VTE.  Goal of Therapy:  Heparin level 0.3-0.7 units/ml aPTT 66-102 seconds Monitor platelets by anticoagulation protocol: Yes   Plan:  - baseline heparin level and aPTT now - start heparin  2000 units IV x1 bolus, then drip at 1000 units/hr - check 6 hr heparin level and aPTT  Brycen Bean P 03/30/2022,3:37 PM  ______________________________  Adden: baseline aPTT 28 sec, heparin level <0.10. Will monitor and dose heparin drip with heparin level.since baseline heparin level is undetectable.  13/8/23, PharmD, BCPS 03/30/2022 8:57 PM

## 2022-03-30 NOTE — ED Notes (Signed)
[facility] called for [facility] to [facility] transport.

## 2022-03-30 NOTE — ED Notes (Signed)
Needs 20gIV for CT

## 2022-03-30 NOTE — ED Notes (Signed)
Pt has been wiped down with CHG wipes and is ready for transport.

## 2022-03-30 NOTE — ED Notes (Signed)
[facility] at bedside and report given to [facility] staff.

## 2022-05-25 ENCOUNTER — Encounter (HOSPITAL_COMMUNITY): Payer: 59

## 2022-05-25 ENCOUNTER — Emergency Department (HOSPITAL_COMMUNITY)
Admission: EM | Admit: 2022-05-25 | Discharge: 2022-05-25 | Discharge status: Against Medical Advice | Payer: Medicare Other | Attending: Emergency Medicine | Admitting: Emergency Medicine

## 2022-05-25 ENCOUNTER — Emergency Department (HOSPITAL_COMMUNITY): Payer: Medicare Other

## 2022-05-25 DIAGNOSIS — M79605 Pain in left leg: Secondary | ICD-10-CM | POA: Insufficient documentation

## 2022-05-25 DIAGNOSIS — Z5321 Procedure and treatment not carried out due to patient leaving prior to being seen by health care provider: Secondary | ICD-10-CM | POA: Diagnosis not present

## 2022-05-25 LAB — COMPREHENSIVE METABOLIC PANEL
ALT: 18 U/L (ref 0–44)
AST: 17 U/L (ref 15–41)
Albumin: 3.5 g/dL (ref 3.5–5.0)
Alkaline Phosphatase: 71 U/L (ref 38–126)
Anion gap: 10 (ref 5–15)
BUN: 23 mg/dL (ref 8–23)
CO2: 23 mmol/L (ref 22–32)
Calcium: 9 mg/dL (ref 8.9–10.3)
Chloride: 101 mmol/L (ref 98–111)
Creatinine, Ser: 0.82 mg/dL (ref 0.44–1.00)
GFR, Estimated: >60 mL/min (ref >60)
Glucose, Bld: 117 mg/dL — ABNORMAL HIGH (ref 70–99)
Potassium: 3.9 mmol/L (ref 3.5–5.1)
Sodium: 134 mmol/L — ABNORMAL LOW (ref 135–145)
Total Bilirubin: 0.7 mg/dL (ref 0.3–1.2)
Total Protein: 7.1 g/dL (ref 6.5–8.1)

## 2022-05-25 LAB — CBC WITH DIFFERENTIAL/PLATELET
Abs Immature Granulocytes: 0.04 10*3/uL (ref 0.00–0.07)
Basophils Absolute: 0.1 10*3/uL (ref 0.0–0.1)
Basophils Relative: 1 %
Eosinophils Absolute: 0.2 10*3/uL (ref 0.0–0.5)
Eosinophils Relative: 1 %
HCT: 45.9 % (ref 36.0–46.0)
Hemoglobin: 14.7 g/dL (ref 12.0–15.0)
Immature Granulocytes: 0 %
Lymphocytes Relative: 35 %
Lymphs Abs: 4.8 10*3/uL — ABNORMAL HIGH (ref 0.7–4.0)
MCH: 29.3 pg (ref 26.0–34.0)
MCHC: 32 g/dL (ref 30.0–36.0)
MCV: 91.4 fL (ref 80.0–100.0)
Monocytes Absolute: 1.1 10*3/uL — ABNORMAL HIGH (ref 0.1–1.0)
Monocytes Relative: 8 %
Neutro Abs: 7.5 10*3/uL (ref 1.7–7.7)
Neutrophils Relative %: 55 %
Platelets: 286 10*3/uL (ref 150–400)
RBC: 5.02 MIL/uL (ref 3.87–5.11)
RDW: 13.4 % (ref 11.5–15.5)
WBC: 13.7 10*3/uL — ABNORMAL HIGH (ref 4.0–10.5)
nRBC: 0 % (ref 0.0–0.2)

## 2022-05-25 NOTE — Progress Notes (Addendum)
Attempted to call for [name] x5. No answer.  Please page vascular if patient returns.   Rachel Ewing 05/25/2022 3:45 PM

## 2022-05-25 NOTE — Progress Notes (Signed)
Attempted [facility] again, still no answer in waiting room.   Rachel Ewing 05/25/2022 4:37 PM

## 2022-05-25 NOTE — ED Triage Notes (Signed)
Patient with history of vascular stents in bilateral lower extremities with complications in both legs here with complaint of pain in left leg and discolored toes in her left foot since 12/26 of last year. Patient is alert, oriented, and in no apparent distress at this time.

## 2022-05-25 NOTE — ED Provider Triage Note (Signed)
Emergency Medicine Provider Triage Evaluation Note  Gift Rueckert , a 72 y.o. female  was evaluated in triage.  Pt complains of lack of pulse in left leg.  History of bilateral femoral artery stents in place due to occlusion.  States that she was evaluated by her doctor outpatient and was told that there was no pulse detected when seen by them and they recommended further evaluation emergency department  Review of Systems  Positive: As above Negative: As above  Physical Exam  BP (!) 149/83 (BP Location: Right Arm)   Pulse 100   Temp 99.1 F (37.3 C) (Oral)   Resp (!) 22   SpO2 98%  Gen:   Awake, no distress Resp:  Normal effort  MSK:   Moves extremities without difficulty  Other:  No pulses detected in left leg  Medical Decision Making  Medically screening exam initiated at 2:28 PM.  Appropriate orders placed.  Maurica Omura was informed that the remainder of the evaluation will be completed by another provider, this initial triage assessment does not replace that evaluation, and the importance of remaining in the ED until their evaluation is complete.   Luvenia Heller, PA-C 05/25/22 1432

## 2022-05-25 NOTE — ED Notes (Signed)
Called multiple times and no response  

## 2023-01-10 DIAGNOSIS — M79672 Pain in left foot: Secondary | ICD-10-CM | POA: Diagnosis not present

## 2023-01-10 DIAGNOSIS — I10 Essential (primary) hypertension: Secondary | ICD-10-CM | POA: Diagnosis not present

## 2023-01-10 DIAGNOSIS — G90522 Complex regional pain syndrome I of left lower limb: Secondary | ICD-10-CM | POA: Diagnosis not present

## 2023-01-10 DIAGNOSIS — M792 Neuralgia and neuritis, unspecified: Secondary | ICD-10-CM | POA: Diagnosis not present

## 2023-01-10 DIAGNOSIS — F1120 Opioid dependence, uncomplicated: Secondary | ICD-10-CM | POA: Diagnosis not present

## 2023-01-13 DIAGNOSIS — Z1159 Encounter for screening for other viral diseases: Secondary | ICD-10-CM | POA: Diagnosis not present

## 2023-01-13 DIAGNOSIS — Z113 Encounter for screening for infections with a predominantly sexual mode of transmission: Secondary | ICD-10-CM | POA: Diagnosis not present

## 2023-01-13 DIAGNOSIS — Z136 Encounter for screening for cardiovascular disorders: Secondary | ICD-10-CM | POA: Diagnosis not present

## 2023-01-13 DIAGNOSIS — Z114 Encounter for screening for human immunodeficiency virus [HIV]: Secondary | ICD-10-CM | POA: Diagnosis not present

## 2023-01-13 DIAGNOSIS — B009 Herpesviral infection, unspecified: Secondary | ICD-10-CM | POA: Diagnosis not present

## 2023-01-13 DIAGNOSIS — N898 Other specified noninflammatory disorders of vagina: Secondary | ICD-10-CM | POA: Diagnosis not present

## 2023-01-13 DIAGNOSIS — Z79899 Other long term (current) drug therapy: Secondary | ICD-10-CM | POA: Diagnosis not present

## 2023-01-13 DIAGNOSIS — B379 Candidiasis, unspecified: Secondary | ICD-10-CM | POA: Diagnosis not present

## 2023-01-13 DIAGNOSIS — E559 Vitamin D deficiency, unspecified: Secondary | ICD-10-CM | POA: Diagnosis not present

## 2023-01-19 DIAGNOSIS — R7303 Prediabetes: Secondary | ICD-10-CM | POA: Diagnosis not present

## 2023-01-19 DIAGNOSIS — E785 Hyperlipidemia, unspecified: Secondary | ICD-10-CM | POA: Diagnosis not present

## 2023-01-19 DIAGNOSIS — B3731 Acute candidiasis of vulva and vagina: Secondary | ICD-10-CM | POA: Diagnosis not present

## 2023-01-19 DIAGNOSIS — Z0001 Encounter for general adult medical examination with abnormal findings: Secondary | ICD-10-CM | POA: Diagnosis not present

## 2023-01-19 DIAGNOSIS — Z0000 Encounter for general adult medical examination without abnormal findings: Secondary | ICD-10-CM | POA: Diagnosis not present

## 2023-01-19 DIAGNOSIS — E559 Vitamin D deficiency, unspecified: Secondary | ICD-10-CM | POA: Diagnosis not present

## 2023-01-19 DIAGNOSIS — B009 Herpesviral infection, unspecified: Secondary | ICD-10-CM | POA: Diagnosis not present

## 2023-01-19 DIAGNOSIS — D6869 Other thrombophilia: Secondary | ICD-10-CM | POA: Diagnosis not present

## 2023-01-19 DIAGNOSIS — F1120 Opioid dependence, uncomplicated: Secondary | ICD-10-CM | POA: Diagnosis not present

## 2023-01-30 DIAGNOSIS — J449 Chronic obstructive pulmonary disease, unspecified: Secondary | ICD-10-CM | POA: Diagnosis not present

## 2023-01-30 DIAGNOSIS — D692 Other nonthrombocytopenic purpura: Secondary | ICD-10-CM | POA: Diagnosis not present

## 2023-01-30 DIAGNOSIS — J209 Acute bronchitis, unspecified: Secondary | ICD-10-CM | POA: Diagnosis not present

## 2023-01-30 DIAGNOSIS — F17210 Nicotine dependence, cigarettes, uncomplicated: Secondary | ICD-10-CM | POA: Diagnosis not present

## 2023-02-13 ENCOUNTER — Other Ambulatory Visit: Payer: Self-pay

## 2023-02-13 ENCOUNTER — Emergency Department (HOSPITAL_COMMUNITY): Payer: Medicare HMO

## 2023-02-13 ENCOUNTER — Encounter (HOSPITAL_COMMUNITY): Payer: Self-pay

## 2023-02-13 ENCOUNTER — Emergency Department (HOSPITAL_COMMUNITY)
Admission: EM | Admit: 2023-02-13 | Discharge: 2023-02-13 | Discharge status: Against Medical Advice | Payer: Medicare HMO | Attending: Emergency Medicine | Admitting: Emergency Medicine

## 2023-02-13 DIAGNOSIS — Z5321 Procedure and treatment not carried out due to patient leaving prior to being seen by health care provider: Secondary | ICD-10-CM | POA: Diagnosis not present

## 2023-02-13 DIAGNOSIS — R0602 Shortness of breath: Secondary | ICD-10-CM | POA: Insufficient documentation

## 2023-02-13 DIAGNOSIS — Z1152 Encounter for screening for COVID-19: Secondary | ICD-10-CM | POA: Insufficient documentation

## 2023-02-13 DIAGNOSIS — R918 Other nonspecific abnormal finding of lung field: Secondary | ICD-10-CM | POA: Diagnosis not present

## 2023-02-13 LAB — RESP PANEL BY RT-PCR (RSV, FLU A&B, COVID)  RVPGX2
Influenza A by PCR: NEGATIVE
Influenza B by PCR: NEGATIVE
Resp Syncytial Virus by PCR: NEGATIVE
SARS Coronavirus 2 by RT PCR: NEGATIVE

## 2023-02-13 MED ORDER — ALBUTEROL SULFATE (2.5 MG/3ML) 0.083% IN NEBU: 3.0000 mL | INHALATION_SOLUTION | RESPIRATORY_TRACT | Status: DC | PRN

## 2023-02-13 NOTE — ED Provider Triage Note (Signed)
Emergency Medicine Provider Triage Evaluation Note  Rachel Ewing , a 72 y.o. female  was evaluated in triage.  Pt complains of shortness of breath.  Patient's been complaining of intermittent shortness of breath for a while.  She states that she is currently taking prednisone and doxycycline for bronchitis which she was diagnosed with last week.  She reports that she was short of breath when EMS picked her up but was given albuterol and IV steroids and now she is feeling better.  Review of Systems  Positive: SOB Negative: Chest pain, fever  Physical Exam  BP (!) 144/88 (BP Location: Right Leg)   Pulse 81   Temp 97.6 F (36.4 C) (Oral)   Resp (!) 22   Ht 5\' 4"  (1.626 m)   Wt 62.1 kg   SpO2 100%   BMI 23.52 kg/m  Gen:   Awake, no distress   Resp:  Normal effort  MSK:   Moves extremities without difficulty  Other:    Medical Decision Making  Medically screening exam initiated at 2:29 PM.  Appropriate orders placed.  Garry Sackett was informed that the remainder of the evaluation will be completed by another provider, this initial triage assessment does not replace that evaluation, and the importance of remaining in the ED until their evaluation is complete.   Maxwell Marion, PA-C 02/13/23 1432

## 2023-02-13 NOTE — ED Notes (Signed)
Pt iv removed and pt eloped from the lobby.

## 2023-02-13 NOTE — ED Triage Notes (Signed)
SOB x "a long time".  Reports it got worse while she was doing laundry.  Bilateral lower lobe wheezing.  EMS gave 5 albuterol, 0.5 atrovent and solumedrol 125mg .  Breathing increased.  Patient in no distress upon arrival.

## 2023-02-14 DIAGNOSIS — F17210 Nicotine dependence, cigarettes, uncomplicated: Secondary | ICD-10-CM | POA: Diagnosis not present

## 2023-02-14 DIAGNOSIS — R0602 Shortness of breath: Secondary | ICD-10-CM | POA: Diagnosis not present

## 2023-02-14 DIAGNOSIS — J441 Chronic obstructive pulmonary disease with (acute) exacerbation: Secondary | ICD-10-CM | POA: Diagnosis not present

## 2023-02-14 DIAGNOSIS — J410 Simple chronic bronchitis: Secondary | ICD-10-CM | POA: Diagnosis not present

## 2023-02-16 ENCOUNTER — Other Ambulatory Visit: Payer: Self-pay | Admitting: *Deleted

## 2023-02-16 DIAGNOSIS — M79606 Pain in leg, unspecified: Secondary | ICD-10-CM

## 2023-02-21 NOTE — Progress Notes (Signed)
Patient ID: Rachel Ewing, female   DOB: 01-15-1951, 72 y.o.   MRN: [id number]  Reason for Consult: No chief complaint on file.   Referred by Cristino Martes, NP  Subjective:     HPI  Rachel Ewing is a 72 y.o. female with a history of peripheral arterial disease who presents to establish care.  She has a past medical history of hypertension, COPD, CAD and prediabetes.  Previous vascular surgery and emergency department notes were reviewed. in July 2023 she underwent complete endovascular reconstruction of the aorta bifurcation.  In September it sounds like she had a cyst and it underwent left femoral thrombectomy.  And then in January she underwent what sounds like explant and aortobifemoral bypass.  She spent some time in the ICU during that hospital stay but is now recovered.  She reports she is most frustrated with her numbness and tingling pain in her left foot and it affects her walking as she cannot feel her foot in space.  She denies claudication, rest pain or nonhealing wounds. She is compliant with aspirin, plavix, Xarelto(2.5) and Crestor  Past Medical History:  Diagnosis Date   High cholesterol    Hypertension    No family history on file. Past Surgical History:  Procedure Laterality Date   AORTOGRAM      Short Social History:  Social History   Tobacco Use   Smoking status: Every Day    Types: Cigarettes   Smokeless tobacco: Never  Substance Use Topics   Alcohol use: Yes    Allergies  Allergen Reactions   Azithromycin Itching   Codeine Nausea Only and Other (See Comments)    Causes stomach pain Causes stomach pain    Penicillins Other (See Comments) and Rash    Tolerates cephalosporins Cause stomach pain    Aspirin     Can Take 81 mg but not 325 mg. GI Upset (intolerance) Nausea Only   Tramadol Nausea Only   Cyclobenzaprine Nausea Only   Prednisone Nausea Only and Other (See Comments)    Low dose is ok Can only take 10mg  of prednisone- gives her shakes       Current Outpatient Medications  Medication Sig Dispense Refill   albuterol (PROVENTIL) (2.5 MG/3ML) 0.083% nebulizer solution Take 2.5 mg by nebulization every 6 (six) hours as needed for wheezing or shortness of breath.     FLUoxetine (PROZAC) 20 MG capsule Take 20 mg by mouth daily.     gabapentin (NEURONTIN) 300 MG capsule Take 300 mg by mouth daily as needed (pain).     HYDROcodone-acetaminophen (NORCO/VICODIN) 5-325 MG tablet Take 2 tablets by mouth 2 (two) times daily. (Patient not taking: Reported on 03/30/2022)     LINZESS 72 MCG capsule Take 72 mcg by mouth every morning.     lisinopril (ZESTRIL) 10 MG tablet Take 10 mg by mouth daily.     nystatin cream (MYCOSTATIN) Apply 1 Application topically 2 (two) times daily.     rOPINIRole (REQUIP) 1 MG tablet Take 1 mg by mouth at bedtime.     rosuvastatin (CRESTOR) 20 MG tablet Take 20 mg by mouth at bedtime.     tiZANidine (ZANAFLEX) 4 MG tablet Take 4 mg by mouth at bedtime.     TRELEGY ELLIPTA 100-62.5-25 MCG/ACT AEPB Inhale 1 puff into the lungs daily as needed (sob/wheezing).     XARELTO 2.5 MG TABS tablet Take 2.5 mg by mouth in the morning.     No current facility-administered medications for this visit.  Review of Systems  All other systems reviewed and are negative       Objective:  Objective   There were no vitals filed for this visit. There is no height or weight on file to calculate BMI.  Physical Exam General: no acute distress Cardiac: hemodynamically stable, nontachycardic Pulm: normal work of breathing GI: non-tender, no pulsatile mass  Neuro: alert, no focal deficit Extremities: No edema, cyanosis or wounds Vascular: Palpable femorals bilaterally, palpable DP PT on right, palpable PT on left   Data: Right    Rt Pressure (mmHg)IndexWaveform   Comment   +---------+------------------+-----+-----------+--------+  Brachial 213                                          +---------+------------------+-----+-----------+--------+  PTA     181               0.85 multiphasic          +---------+------------------+-----+-----------+--------+  DP      192               0.90 multiphasic          +---------+------------------+-----+-----------+--------+  Great Toe99                0.46 Abnormal             +---------+------------------+-----+-----------+--------+   +---------+------------------+-----+-----------+-------+  Left    Lt Pressure (mmHg)IndexWaveform   Comment  +---------+------------------+-----+-----------+-------+  Brachial 140                                        +---------+------------------+-----+-----------+-------+  PTA     187               0.88 multiphasic         +---------+------------------+-----+-----------+-------+  DP      190               0.89 multiphasic         +---------+------------------+-----+-----------+-------+  Great Toe110               0.52 Abnormal            +---------+------------------+-----+-----------+-------+      Assessment/Plan:     Rachel Ewing is a 72 y.o. female with multiple comorbidities including CAD, COPD and PAD here to establish care she is underwent multiple vascular procedures but ultimately sounds like she has an aortobifemoral bypass.  She has palpable pedal pulses and an ABI of 0.9 bilaterally.  I think her numbness and tingling pain in her left foot is likely related to nerve and muscle damage possibly from her period of ischemia.  She is on pregabalin which does help.  I explained that with continued physical therapy this could get better but will likely never be 100%.  Given her complex history of vascular procedures I explained I would like to get a CT scan with runoff to assess her current anatomy.  Will plan for 19-month follow-up with CTA with runoff and a repeat ABI Continue aspirin, Plavix, 2.5 Xarelto and statin Encourage smoking cessation      Daria Pastures MD [facility] Specialists of [facility]

## 2023-02-24 ENCOUNTER — Ambulatory Visit (INDEPENDENT_AMBULATORY_CARE_PROVIDER_SITE_OTHER): Payer: Medicare HMO | Admitting: Vascular Surgery

## 2023-02-24 ENCOUNTER — Ambulatory Visit (HOSPITAL_COMMUNITY)
Admission: RE | Admit: 2023-02-24 | Discharge: 2023-02-24 | Disposition: A | Discharge status: Home / Self Care | Payer: Medicare HMO | Source: Ambulatory Visit | Attending: Vascular Surgery | Admitting: Vascular Surgery

## 2023-02-24 VITALS — BP 149/78 | HR 66 | Temp 98.1°F | Ht 62.0 in | Wt 133.9 lb

## 2023-02-24 DIAGNOSIS — I739 Peripheral vascular disease, unspecified: Secondary | ICD-10-CM | POA: Diagnosis not present

## 2023-02-24 DIAGNOSIS — M79606 Pain in leg, unspecified: Secondary | ICD-10-CM | POA: Diagnosis not present

## 2023-02-24 LAB — VAS US ABI WITH/WO TBI
Left ABI: 0.89
Right ABI: 0.9

## 2023-03-23 ENCOUNTER — Other Ambulatory Visit: Payer: Self-pay

## 2023-03-23 DIAGNOSIS — I739 Peripheral vascular disease, unspecified: Secondary | ICD-10-CM

## 2023-03-25 ENCOUNTER — Inpatient Hospital Stay (HOSPITAL_COMMUNITY)
Admission: EM | Admit: 2023-03-25 | Discharge: 2023-03-28 | DRG: 522 | Disposition: A | Discharge status: Home Health | Payer: Medicare HMO | Attending: Internal Medicine | Admitting: Internal Medicine

## 2023-03-25 ENCOUNTER — Other Ambulatory Visit: Payer: Self-pay

## 2023-03-25 ENCOUNTER — Encounter (HOSPITAL_COMMUNITY): Payer: Self-pay

## 2023-03-25 ENCOUNTER — Emergency Department (HOSPITAL_COMMUNITY): Payer: Medicare HMO

## 2023-03-25 DIAGNOSIS — Z7901 Long term (current) use of anticoagulants: Secondary | ICD-10-CM

## 2023-03-25 DIAGNOSIS — Z7951 Long term (current) use of inhaled steroids: Secondary | ICD-10-CM | POA: Diagnosis not present

## 2023-03-25 DIAGNOSIS — Z79891 Long term (current) use of opiate analgesic: Secondary | ICD-10-CM | POA: Diagnosis not present

## 2023-03-25 DIAGNOSIS — Y92009 Unspecified place in unspecified non-institutional (private) residence as the place of occurrence of the external cause: Secondary | ICD-10-CM

## 2023-03-25 DIAGNOSIS — I739 Peripheral vascular disease, unspecified: Secondary | ICD-10-CM | POA: Diagnosis present

## 2023-03-25 DIAGNOSIS — G2581 Restless legs syndrome: Secondary | ICD-10-CM

## 2023-03-25 DIAGNOSIS — Z885 Allergy status to narcotic agent status: Secondary | ICD-10-CM

## 2023-03-25 DIAGNOSIS — E785 Hyperlipidemia, unspecified: Secondary | ICD-10-CM

## 2023-03-25 DIAGNOSIS — K219 Gastro-esophageal reflux disease without esophagitis: Secondary | ICD-10-CM | POA: Diagnosis present

## 2023-03-25 DIAGNOSIS — E7800 Pure hypercholesterolemia, unspecified: Secondary | ICD-10-CM | POA: Diagnosis present

## 2023-03-25 DIAGNOSIS — S72001A Fracture of unspecified part of neck of right femur, initial encounter for closed fracture: Secondary | ICD-10-CM | POA: Diagnosis not present

## 2023-03-25 DIAGNOSIS — Z7902 Long term (current) use of antithrombotics/antiplatelets: Secondary | ICD-10-CM

## 2023-03-25 DIAGNOSIS — W010XXA Fall on same level from slipping, tripping and stumbling without subsequent striking against object, initial encounter: Secondary | ICD-10-CM | POA: Diagnosis present

## 2023-03-25 DIAGNOSIS — Z881 Allergy status to other antibiotic agents status: Secondary | ICD-10-CM | POA: Diagnosis not present

## 2023-03-25 DIAGNOSIS — S72002A Fracture of unspecified part of neck of left femur, initial encounter for closed fracture: Secondary | ICD-10-CM | POA: Diagnosis present

## 2023-03-25 DIAGNOSIS — S72012A Unspecified intracapsular fracture of left femur, initial encounter for closed fracture: Principal | ICD-10-CM | POA: Diagnosis present

## 2023-03-25 DIAGNOSIS — Z8679 Personal history of other diseases of the circulatory system: Secondary | ICD-10-CM

## 2023-03-25 DIAGNOSIS — Z886 Allergy status to analgesic agent status: Secondary | ICD-10-CM | POA: Diagnosis not present

## 2023-03-25 DIAGNOSIS — F17210 Nicotine dependence, cigarettes, uncomplicated: Secondary | ICD-10-CM | POA: Diagnosis present

## 2023-03-25 DIAGNOSIS — Z79899 Other long term (current) drug therapy: Secondary | ICD-10-CM | POA: Diagnosis not present

## 2023-03-25 DIAGNOSIS — Z5986 Financial insecurity: Secondary | ICD-10-CM

## 2023-03-25 DIAGNOSIS — I2510 Atherosclerotic heart disease of native coronary artery without angina pectoris: Secondary | ICD-10-CM | POA: Diagnosis present

## 2023-03-25 DIAGNOSIS — R0902 Hypoxemia: Secondary | ICD-10-CM | POA: Diagnosis present

## 2023-03-25 DIAGNOSIS — I10 Essential (primary) hypertension: Secondary | ICD-10-CM | POA: Diagnosis present

## 2023-03-25 DIAGNOSIS — Z880 Allergy status to penicillin: Secondary | ICD-10-CM

## 2023-03-25 DIAGNOSIS — S50312A Abrasion of left elbow, initial encounter: Secondary | ICD-10-CM | POA: Diagnosis present

## 2023-03-25 DIAGNOSIS — J449 Chronic obstructive pulmonary disease, unspecified: Secondary | ICD-10-CM | POA: Diagnosis present

## 2023-03-25 DIAGNOSIS — H9190 Unspecified hearing loss, unspecified ear: Secondary | ICD-10-CM | POA: Diagnosis present

## 2023-03-25 DIAGNOSIS — D62 Acute posthemorrhagic anemia: Secondary | ICD-10-CM | POA: Diagnosis not present

## 2023-03-25 DIAGNOSIS — G629 Polyneuropathy, unspecified: Secondary | ICD-10-CM

## 2023-03-25 DIAGNOSIS — Z888 Allergy status to other drugs, medicaments and biological substances status: Secondary | ICD-10-CM

## 2023-03-25 DIAGNOSIS — S72009A Fracture of unspecified part of neck of unspecified femur, initial encounter for closed fracture: Secondary | ICD-10-CM

## 2023-03-25 DIAGNOSIS — Z716 Tobacco abuse counseling: Secondary | ICD-10-CM

## 2023-03-25 DIAGNOSIS — W19XXXA Unspecified fall, initial encounter: Secondary | ICD-10-CM

## 2023-03-25 HISTORY — DX: Hyperlipidemia, unspecified: E78.5

## 2023-03-25 HISTORY — DX: Peripheral vascular disease, unspecified: I73.9

## 2023-03-25 HISTORY — DX: Restless legs syndrome: G25.81

## 2023-03-25 HISTORY — DX: Polyneuropathy, unspecified: G62.9

## 2023-03-25 LAB — TYPE AND SCREEN
ABO/RH(D): A POS
Antibody Screen: NEGATIVE

## 2023-03-25 LAB — CBC
HCT: 38.4 % (ref 36.0–46.0)
Hemoglobin: 12.3 g/dL (ref 12.0–15.0)
MCH: 29 pg (ref 26.0–34.0)
MCHC: 32 g/dL (ref 30.0–36.0)
MCV: 90.6 fL (ref 80.0–100.0)
Platelets: 245 10*3/uL (ref 150–400)
RBC: 4.24 MIL/uL (ref 3.87–5.11)
RDW: 13 % (ref 11.5–15.5)
WBC: 11.6 10*3/uL — ABNORMAL HIGH (ref 4.0–10.5)
nRBC: 0 % (ref 0.0–0.2)

## 2023-03-25 LAB — BASIC METABOLIC PANEL
Anion gap: 6 (ref 5–15)
BUN: 17 mg/dL (ref 8–23)
CO2: 24 mmol/L (ref 22–32)
Calcium: 8.9 mg/dL (ref 8.9–10.3)
Chloride: 108 mmol/L (ref 98–111)
Creatinine, Ser: 0.93 mg/dL (ref 0.44–1.00)
GFR, Estimated: >60 mL/min (ref >60)
Glucose, Bld: 120 mg/dL — ABNORMAL HIGH (ref 70–99)
Potassium: 4.1 mmol/L (ref 3.5–5.1)
Sodium: 138 mmol/L (ref 135–145)

## 2023-03-25 LAB — MAGNESIUM: Magnesium: 1.9 mg/dL (ref 1.7–2.4)

## 2023-03-25 LAB — PHOSPHORUS: Phosphorus: 2.7 mg/dL (ref 2.5–4.6)

## 2023-03-25 LAB — ABO/RH: ABO/RH(D): A POS

## 2023-03-25 MED ORDER — SERTRALINE HCL 50 MG PO TABS
50.0000 mg | ORAL_TABLET | Freq: Every morning | ORAL | Status: DC
  Administered 2023-03-27 – 2023-03-28 (×2): 50 mg via ORAL
  Filled 2023-03-25 (×2): qty 1

## 2023-03-25 MED ORDER — ACETAMINOPHEN 650 MG RE SUPP
650.0000 mg | Freq: Four times a day (QID) | RECTAL | Status: DC | PRN
Start: 2023-03-25 — End: 2023-03-26

## 2023-03-25 MED ORDER — ENSURE PRE-SURGERY PO LIQD
296.0000 mL | Freq: Once | ORAL | Status: AC
  Administered 2023-03-26: 296 mL via ORAL
  Filled 2023-03-25: qty 296

## 2023-03-25 MED ORDER — OXYCODONE HCL 5 MG PO TABS
5.0000 mg | ORAL_TABLET | Freq: Four times a day (QID) | ORAL | Status: DC | PRN
  Administered 2023-03-25: 5 mg via ORAL
  Filled 2023-03-25: qty 1

## 2023-03-25 MED ORDER — HYDROMORPHONE HCL 1 MG/ML IJ SOLN
0.5000 mg | INTRAMUSCULAR | Status: DC | PRN
  Administered 2023-03-25 – 2023-03-26 (×3): 0.5 mg via INTRAVENOUS
  Filled 2023-03-25 (×3): qty 0.5

## 2023-03-25 MED ORDER — BUDESONIDE 0.5 MG/2ML IN SUSP
0.5000 mg | Freq: Two times a day (BID) | RESPIRATORY_TRACT | Status: DC
  Administered 2023-03-25: 0.5 mg via RESPIRATORY_TRACT
  Filled 2023-03-25: qty 2

## 2023-03-25 MED ORDER — HYDROMORPHONE HCL 1 MG/ML IJ SOLN
1.0000 mg | Freq: Once | INTRAMUSCULAR | Status: AC
  Administered 2023-03-25: 1 mg via INTRAVENOUS
  Filled 2023-03-25 (×2): qty 1

## 2023-03-25 MED ORDER — ROSUVASTATIN CALCIUM 20 MG PO TABS
20.0000 mg | ORAL_TABLET | Freq: Every day | ORAL | Status: DC
  Administered 2023-03-25 – 2023-03-27 (×3): 20 mg via ORAL
  Filled 2023-03-25 (×3): qty 1

## 2023-03-25 MED ORDER — IPRATROPIUM-ALBUTEROL 0.5-2.5 (3) MG/3ML IN SOLN
3.0000 mL | Freq: Four times a day (QID) | RESPIRATORY_TRACT | Status: DC
  Administered 2023-03-25 – 2023-03-26 (×3): 3 mL via RESPIRATORY_TRACT
  Filled 2023-03-25 (×3): qty 3

## 2023-03-25 MED ORDER — ONDANSETRON HCL 4 MG/2ML IJ SOLN
4.0000 mg | Freq: Four times a day (QID) | INTRAMUSCULAR | Status: DC | PRN
  Administered 2023-03-25: 4 mg via INTRAVENOUS
  Filled 2023-03-25: qty 2

## 2023-03-25 MED ORDER — NICOTINE 21 MG/24HR TD PT24: 21.0000 mg | MEDICATED_PATCH | Freq: Every day | TRANSDERMAL | Status: DC | PRN

## 2023-03-25 MED ORDER — FENTANYL CITRATE PF 50 MCG/ML IJ SOSY
50.0000 ug | PREFILLED_SYRINGE | Freq: Once | INTRAMUSCULAR | Status: AC
  Administered 2023-03-25: 50 ug via INTRAVENOUS
  Filled 2023-03-25: qty 1

## 2023-03-25 MED ORDER — LINACLOTIDE 72 MCG PO CAPS
72.0000 ug | ORAL_CAPSULE | Freq: Every morning | ORAL | Status: DC
Start: 2023-03-26 — End: 2023-03-28
  Administered 2023-03-27 – 2023-03-28 (×2): 72 ug via ORAL
  Filled 2023-03-25 (×3): qty 1

## 2023-03-25 MED ORDER — ONDANSETRON HCL 4 MG PO TABS
4.0000 mg | ORAL_TABLET | Freq: Four times a day (QID) | ORAL | Status: DC | PRN
Start: 2023-03-25 — End: 2023-03-26

## 2023-03-25 MED ORDER — BACITRACIN ZINC 500 UNIT/GM EX OINT
TOPICAL_OINTMENT | Freq: Once | CUTANEOUS | Status: AC
  Filled 2023-03-25: qty 0.9

## 2023-03-25 MED ORDER — LISINOPRIL 10 MG PO TABS
10.0000 mg | ORAL_TABLET | Freq: Every day | ORAL | Status: DC
  Administered 2023-03-25: 10 mg via ORAL
  Filled 2023-03-25: qty 1

## 2023-03-25 MED ORDER — ACETAMINOPHEN 325 MG PO TABS: 650.0000 mg | ORAL_TABLET | Freq: Four times a day (QID) | ORAL | Status: DC | PRN

## 2023-03-25 NOTE — Progress Notes (Signed)
Asked by Dr. Jena Gauss to provide surgical treatment of patient's left femoral neck fracture. Patient requires LEFT total hip arthroplasty for pain control and immediate mobilization. Plan for L THA tomorrow am. NPO after MN tonight. Hold chemical DVT ppx. Preop orders placed. Type and screen ordered. Full consult to follow in am.

## 2023-03-25 NOTE — ED Triage Notes (Signed)
Pt BIBA for complaints of hip pain r/t witnessed fall  Per EMS patient tripped and fell injuring l hip and l forearm endorses blood thinner use denies loss of consciousness or hitting head.  Pt is a&ox4   Pending EDP evaluation

## 2023-03-25 NOTE — ED Notes (Signed)
ED TO INPATIENT HANDOFF REPORT  Name/Age/Gender Rachel Ewing 72 y.o. female  Code Status   Home/SNF/Other Home  Chief Complaint Closed fracture of left hip (HCC) [S72.002A]  Level of Care/Admitting Diagnosis ED Disposition     ED Disposition  Admit   Condition  --   Comment  Hospital Area: [facility] [[id number]]  Level of Care: Med-Surg [16]  May admit patient to [facility] or [facility] if equivalent level of care is available:: No  Covid Evaluation: Asymptomatic - no recent exposure (last 10 days) testing not required  Diagnosis: Closed fracture of left hip [facility]) [[id number]]  Admitting Physician: Bobette Mo [[id number]]  Attending Physician: Bobette Mo [[id number]]  Certification:: I certify this patient will need inpatient services for at least 2 midnights  Expected Medical Readiness: 03/29/2023          Medical History Past Medical History:  Diagnosis Date   High cholesterol    Hypertension     Allergies Allergies  Allergen Reactions   Azithromycin Itching   Codeine Nausea Only and Other (See Comments)    Causes stomach pain     Penicillins Other (See Comments) and Rash    Tolerates cephalosporins Cause stomach pain    Aspirin     Can Take 81 mg but not 325 mg. GI Upset (intolerance) Nausea Only   Tramadol Nausea Only   Cyclobenzaprine Nausea Only   Prednisone Nausea Only and Other (See Comments)    Low dose is ok Can only take 10mg  of prednisone- gives her shakes      IV Location/Drains/Wounds Patient Lines/Drains/Airways Status     Active Line/Drains/Airways     Name Placement date Placement time Site Days   Peripheral IV 03/25/23 20 G Right Antecubital 03/25/23  1052  Antecubital  less than 1            Labs/Imaging Results for orders placed or performed during the hospital encounter of 03/25/23 (from the past 48 hour(s))  Basic metabolic panel     Status: Abnormal   Collection Time: 03/25/23  2:01  PM  Result Value Ref Range   Sodium 138 135 - 145 mmol/L   Potassium 4.1 3.5 - 5.1 mmol/L   Chloride 108 98 - 111 mmol/L   CO2 24 22 - 32 mmol/L   Glucose, Bld 120 (H) 70 - 99 mg/dL    Comment: Glucose reference range applies only to samples taken after fasting for at least 8 hours.   BUN 17 8 - 23 mg/dL   Creatinine, Ser 2.95 0.44 - 1.00 mg/dL   Calcium 8.9 8.9 - 62.1 mg/dL   GFR, Estimated >30 >86 mL/min    Comment: (NOTE) Calculated using the CKD-EPI Creatinine Equation (2021)    Anion gap 6 5 - 15    Comment: Performed at [facility], [address], [place], [place] [address]  CBC     Status: Abnormal   Collection Time: 03/25/23  2:01 PM  Result Value Ref Range   WBC 11.6 (H) 4.0 - 10.5 K/uL   RBC 4.24 3.87 - 5.11 MIL/uL   Hemoglobin 12.3 12.0 - 15.0 g/dL   HCT 96.2 95.2 - 84.1 %   MCV 90.6 80.0 - 100.0 fL   MCH 29.0 26.0 - 34.0 pg   MCHC 32.0 30.0 - 36.0 g/dL   RDW 32.4 40.1 - 02.7 %   Platelets 245 150 - 400 K/uL   nRBC 0.0 0.0 - 0.2 %  Comment: Performed at [facility], [address] [place], [place] [address]  Type and screen [facility]     Status: None   Collection Time: 03/25/23  2:01 PM  Result Value Ref Range   ABO/RH(D) A POS    Antibody Screen NEG    Sample Expiration      03/28/2023,2359 Performed at [facility], [address], [place], [place] [address]   Magnesium     Status: None   Collection Time: 03/25/23  2:01 PM  Result Value Ref Range   Magnesium 1.9 1.7 - 2.4 mg/dL    Comment: Performed at [facility], [address], [place], [place] [address]  Phosphorus     Status: None   Collection Time: 03/25/23  2:01 PM  Result Value Ref Range   Phosphorus 2.7 2.5 - 4.6 mg/dL    Comment: Performed at [facility], [address] [place], [place] [address]   DG Knee Left Port  Result Date: 03/25/2023 CLINICAL DATA:  Femoral neck  fracture. EXAM: PORTABLE LEFT KNEE - 2 VIEW COMPARISON:  None Available. FINDINGS: No fracture or dislocation. Preserved joint spaces and bone mineralization. Hyperostosis along the patella. No joint effusion on lateral view. IMPRESSION: No acute osseous abnormality. Electronically Signed   By: Karen Kays M.D.   On: 03/25/2023 14:33   DG Chest Portable 1 View  Result Date: 03/25/2023 CLINICAL DATA:  Fall.  Cough.  COPD. EXAM: PORTABLE CHEST 1 VIEW COMPARISON:  Radiographs 02/13/2023 and 09/15/2021. FINDINGS: 1332 hours. Lordotic positioning. The heart size and mediastinal contours are normal. There is aortic atherosclerosis. The lungs are clear. No pleural effusion or pneumothorax. No acute osseous findings are evident. Telemetry leads overlie the chest. IMPRESSION: No evidence of acute chest injury or active cardiopulmonary process. Electronically Signed   By: Carey Bullocks M.D.   On: 03/25/2023 13:59   DG Hip Unilat W or Wo Pelvis 2-3 Views Left  Result Date: 03/25/2023 CLINICAL DATA:  Pain after fall EXAM: DG HIP (WITH OR WITHOUT PELVIS) 3V LEFT COMPARISON:  None Available. FINDINGS: Mildly displaced subcapital left femoral neck fracture. No additional fracture or dislocation. Preserved joint spaces. Osteopenia. Presumed vascular calcifications in the pelvis. Iliac stents. IMPRESSION: Left subcapital femoral neck fracture. Electronically Signed   By: Karen Kays M.D.   On: 03/25/2023 12:19    Pending Labs Unresulted Labs (From admission, onward)     Start     Ordered   03/25/23 1600  ABO/Rh  Once,   R        03/25/23 1600            Vitals/Pain Today's Vitals   03/25/23 1330 03/25/23 1430 03/25/23 1455 03/25/23 1538  BP: 114/86 115/81    Pulse: 73 78    Resp: 18 12    Temp:   98.1 F (36.7 C)   TempSrc:   Oral   SpO2: 93% 94%    Weight:      Height:      PainSc:    0-No pain    Isolation Precautions No active isolations  Medications Medications  feeding supplement  (ENSURE PRE-SURGERY) liquid 296 mL (has no administration in time range)  fentaNYL (SUBLIMAZE) injection 50 mcg (50 mcg Intravenous Given 03/25/23 1221)  bacitracin ointment ( Topical Given 03/25/23 1426)  HYDROmorphone (DILAUDID) injection 1 mg (1 mg Intravenous Given by Other 03/25/23 1422)    Mobility non-ambulatory

## 2023-03-25 NOTE — Progress Notes (Addendum)
[facility] DAY SHIFT ADMITTING TEAM:  The case was discussed with our ED colleagues.  We will be admitting this patient to the hospitalist service, but have requested to first obtain an EKG, portable chest radiograph and basic lab work so we can properly determine the level of care and further preop evaluation.  Orthopedic surgery is planning to intervene on Monday, likely with a total hip arthroplasty.  The ED providers will reconsult [place] once about a workup done.  Sanda Klein, MD.

## 2023-03-25 NOTE — H&P (Signed)
History and Physical    Patient: Rachel Ewing FAO:[id number] DOB: June 08, 1950 DOA: 03/25/2023 DOS: the patient was seen and examined on 03/25/2023 PCP: Pcp, No  Patient coming from: Home  Chief Complaint:  Chief Complaint  Patient presents with   Hip Pain   HPI: Rachel Ewing is a 72 y.o. female with medical history significant of hyperlipidemia, hypertension, CAD, PAD, history of stress-induced cardiomyopathy, COPD, active tobacco use, GERD who had a fall at home while she was trying to walk her dogs landing on her left hip area, developing immediate pain and inability to bear weight.  No prodromal symptoms.  No head trauma or LOC.He denied fever, chills, rhinorrhea, sore throat, wheezing or hemoptysis.  No chest pain, palpitations, diaphoresis, PND, orthopnea or pitting edema of the lower extremities.  No abdominal pain, nausea, emesis, diarrhea, constipation, melena or hematochezia.  No flank pain, dysuria, frequency or hematuria.  No polyuria, polydipsia, polyphagia or blurred vision.  She last took her Xarelto yesterday morning.  Lab work: CBC Stroble count 11.6, hemoglobin 4.3 g deciliter platelets 245.  BMP showing a glucose of 120 mg/deciliter but was otherwise unremarkable.  Normal phosphorus and magnesium.  Imaging: Left hip x-ray showing left subcapital femoral neck fracture.  Portable 1 view chest radiograph with no evidence of acute chest injury or active cardiopulmonary disease.  There is aortic atherosclerosis.  Left knee x-ray was negative for fracture.  ED course: Initial vital signs were temperature 97.4 F, pulse 69, respiration 14, BP 147/87 mmHg O2 sat 95% on room air.  The patient received fentanyl 50 mcg IVP and hydromorphone 1 mg IVP in the emergency department.   Review of Systems: As mentioned in the history of present illness. All other systems reviewed and are negative. Past Medical History:  Diagnosis Date   High cholesterol    Hypertension    Past Surgical  History:  Procedure Laterality Date   AORTOGRAM     Social History:  reports that she has been smoking cigarettes. She has never used smokeless tobacco. She reports that she does not currently use alcohol. She reports that she does not use drugs.  Allergies  Allergen Reactions   Azithromycin Itching   Codeine Nausea Only and Other (See Comments)    Causes stomach pain     Penicillins Other (See Comments) and Rash    Tolerates cephalosporins Cause stomach pain    Aspirin     Can Take 81 mg but not 325 mg. GI Upset (intolerance) Nausea Only   Tramadol Nausea Only   Cyclobenzaprine Nausea Only   Prednisone Nausea Only and Other (See Comments)    Low dose is ok Can only take 10mg  of prednisone- gives her shakes      History reviewed. No pertinent family history.  Prior to Admission medications   Medication Sig Start Date End Date Taking? Authorizing Provider  albuterol (PROVENTIL) (2.5 MG/3ML) 0.083% nebulizer solution Take 2.5 mg by nebulization every 6 (six) hours as needed for wheezing or shortness of breath. 08/30/21   [provider]  clopidogrel (PLAVIX) 75 MG tablet Take 75 mg by mouth every morning. 01/13/23   [provider]  fluconazole (DIFLUCAN) 150 MG tablet Take 150 mg by mouth every 3 (three) days. 01/16/23   [provider]  FLUoxetine (PROZAC) 20 MG capsule Take 20 mg by mouth daily. 10/26/21   [provider]  gabapentin (NEURONTIN) 300 MG capsule Take 300 mg by mouth daily as needed (pain). Patient not taking: Reported on  02/24/2023 10/26/21   [provider]  HYDROcodone-acetaminophen (NORCO/VICODIN) 5-325 MG tablet Take 2 tablets by mouth 2 (two) times daily. 01/28/22   [provider]  LINZESS 72 MCG capsule Take 72 mcg by mouth every morning. 10/26/21   [provider]  lisinopril (ZESTRIL) 10 MG tablet Take 10 mg by mouth daily. 11/08/21   [provider]  nystatin cream (MYCOSTATIN) Apply 1  Application topically 2 (two) times daily. Patient not taking: Reported on 02/24/2023 03/29/22   [provider]  pregabalin (LYRICA) 25 MG capsule Take 25 mg by mouth 3 (three) times daily as needed. 12/13/22   [provider]  rOPINIRole (REQUIP) 1 MG tablet Take 1 mg by mouth at bedtime. 11/08/21   [provider]  rosuvastatin (CRESTOR) 20 MG tablet Take 20 mg by mouth at bedtime. 08/07/21   [provider]  sertraline (ZOLOFT) 50 MG tablet Take 50 mg by mouth every morning. 01/13/23   [provider]  tiZANidine (ZANAFLEX) 4 MG tablet Take 4 mg by mouth at bedtime. 08/30/21   [provider]  TRELEGY ELLIPTA 100-62.5-25 MCG/ACT AEPB Inhale 1 puff into the lungs daily as needed (sob/wheezing). 08/07/21   [provider]  XARELTO 2.5 MG TABS tablet Take 2.5 mg by mouth in the morning. 08/07/21   [provider]    Physical Exam: Vitals:   03/25/23 1300 03/25/23 1330 03/25/23 1430 03/25/23 1455  BP: 102/89 114/86 115/81   Pulse: 68 73 78   Resp: 12 18 12    Temp:    98.1 F (36.7 C)  TempSrc:    Oral  SpO2: 92% 93% 94%   Weight:      Height:       Physical Exam Vitals and nursing note reviewed.  Constitutional:      General: She is sleeping. She is not in acute distress.    Appearance: Normal appearance. She is ill-appearing.     Interventions: Nasal cannula in place.  HENT:     Head: Normocephalic.     Nose: No rhinorrhea.     Mouth/Throat:     Mouth: Mucous membranes are moist.  Eyes:     General: No scleral icterus.    Pupils: Pupils are equal, round, and reactive to light.  Cardiovascular:     Rate and Rhythm: Normal rate and regular rhythm.  Pulmonary:     Effort: Pulmonary effort is normal. No respiratory distress.     Breath sounds: Wheezing and rhonchi present. No rales.  Abdominal:     General: Bowel sounds are normal.     Palpations: Abdomen is soft.  Musculoskeletal:     Cervical back: Neck  supple.     Right lower leg: No edema.     Left lower leg: No edema.  Skin:    General: Skin is warm and dry.  Neurological:     General: No focal deficit present.     Mental Status: She is easily aroused.  Psychiatric:        Mood and Affect: Mood normal.        Behavior: Behavior normal. Behavior is cooperative.     Data Reviewed:  Results are pending, will review when available.  EKG: Vent. rate 75 BPM PR interval 144 ms QRS duration 81 ms QT/QTcB 445/498 ms P-R-T axes 59 55 67 Sinus rhythm Borderline prolonged QT interval  Assessment and Plan: Principal Problem:   Closed fracture of left hip (HCC) Admit to telemetry/inpatient. Ice area  as needed. Buck's traction per protocol. Analgesics as needed. Antiemetics as needed. Consult TOC team. Consult nutritional services. PT evaluation after surgery. Continue holding Xarelto. Orthopedic surgery input appreciated.  Active Problems:   CAD (coronary artery disease) Continue rosuvastatin. Hold anticoagulation.    COPD, moderate (HCC) Pulmicort 0.5 mg twice daily. DuoNebs 4 times a day. Resume Trelegy Ellipta twice daily once improved.    Essential hypertension Continue lisinopril 10 mg p.o. daily.    Gastroesophageal reflux disease without esophagitis Will start PPI while in the hospital.    Hyperlipidemia Continue rosuvastatin 20 mg p.o. daily.    Restless leg syndrome Not sure if taking requisite.    PAD (peripheral artery disease) (HCC) Has been off Xarelto for 36 hours. Discussed with orthopedic surgery.    Peripheral neuropathy Currently off gabapentin.     Advance Care Planning:   Code Status: Full Code   Consults: Orthopedic surgery Samson Frederic MD).  Family Communication: Her significant other Jennette Kettle) was at bedside.  Severity of Illness: The appropriate patient status for this patient is INPATIENT. Inpatient status is judged to be reasonable and necessary in order to provide the  required intensity of service to ensure the patient's safety. The patient's presenting symptoms, physical exam findings, and initial radiographic and laboratory data in the context of their chronic comorbidities is felt to place them at high risk for further clinical deterioration. Furthermore, it is not anticipated that the patient will be medically stable for discharge from the hospital within 2 midnights of admission.   * I certify that at the point of admission it is my clinical judgment that the patient will require inpatient hospital care spanning beyond 2 midnights from the point of admission due to high intensity of service, high risk for further deterioration and high frequency of surveillance required.*  Author: Bobette Mo, MD 03/25/2023 3:18 PM  For on call review [email or web address].   This document was prepared using Dragon voice recognition software and may contain some unintended transcription errors.

## 2023-03-25 NOTE — Progress Notes (Signed)
Ortho Trauma Note  Consult received for displaced left femoral neck fracture. Based on patients age, will likely need a total hip arthoplasty. Surgery will not occur today and likely will not occur until Monday. Please admit to hospitalist service and orthopaedics will consult.  Roby Lofts, MD [facility] [phone] (office) [email or web address]

## 2023-03-25 NOTE — ED Provider Notes (Signed)
[place] EMERGENCY DEPARTMENT AT [facility] Provider Note   CSN: [id number] Arrival date & time: 03/25/23  1034     History  Chief Complaint  Patient presents with   Hip Pain    Rachel Ewing is a 72 y.o. female with a history of COPD and hypertension who presents the ED today after a fall.  Patient states that she tripped while taking her dog outside and landed on her left hip.  Patient has pain to the anterior hip and is unable to move her leg secondary to pain.  She also has an abrasion to the left elbow from the fall without any pain. No LOC, head injury, or blood thinner use. Denies any additional complaints or concerns at this time.    Home Medications Prior to Admission medications   Medication Sig Start Date End Date Taking? Authorizing Provider  albuterol (PROVENTIL) (2.5 MG/3ML) 0.083% nebulizer solution Take 2.5 mg by nebulization every 6 (six) hours as needed for wheezing or shortness of breath. 08/30/21   [provider]  clopidogrel (PLAVIX) 75 MG tablet Take 75 mg by mouth every morning. 01/13/23   [provider]  fluconazole (DIFLUCAN) 150 MG tablet Take 150 mg by mouth every 3 (three) days. 01/16/23   [provider]  FLUoxetine (PROZAC) 20 MG capsule Take 20 mg by mouth daily. 10/26/21   [provider]  gabapentin (NEURONTIN) 300 MG capsule Take 300 mg by mouth daily as needed (pain). Patient not taking: Reported on 02/24/2023 10/26/21   [provider]  HYDROcodone-acetaminophen (NORCO/VICODIN) 5-325 MG tablet Take 2 tablets by mouth 2 (two) times daily. 01/28/22   [provider]  LINZESS 72 MCG capsule Take 72 mcg by mouth every morning. 10/26/21   [provider]  lisinopril (ZESTRIL) 10 MG tablet Take 10 mg by mouth daily. 11/08/21   [provider]  nystatin cream (MYCOSTATIN) Apply 1 Application topically 2 (two) times daily. Patient not taking: Reported on 02/24/2023 03/29/22   [provider]  pregabalin (LYRICA) 25 MG capsule Take 25 mg by mouth 3 (three) times daily as needed. 12/13/22   [provider]  rOPINIRole (REQUIP) 1 MG tablet Take 1 mg by mouth at bedtime. 11/08/21   [provider]  rosuvastatin (CRESTOR) 20 MG tablet Take 20 mg by mouth at bedtime. 08/07/21   [provider]  sertraline (ZOLOFT) 50 MG tablet Take 50 mg by mouth every morning. 01/13/23   [provider]  tiZANidine (ZANAFLEX) 4 MG tablet Take 4 mg by mouth at bedtime. 08/30/21   [provider]  TRELEGY ELLIPTA 100-62.5-25 MCG/ACT AEPB Inhale 1 puff into the lungs daily as needed (sob/wheezing). 08/07/21   [provider]  XARELTO 2.5 MG TABS tablet Take 2.5 mg by mouth in the morning. 08/07/21   [provider]      Allergies    Azithromycin, Codeine, Penicillins, Aspirin, Tramadol, Cyclobenzaprine, and Prednisone    Review of Systems   Review of Systems  Musculoskeletal:        Left hip pain  All other systems reviewed and are negative.   Physical Exam Updated Vital Signs BP 115/81   Pulse 78   Temp 98.1 F (36.7 C) (Oral)   Resp 12   Ht 5\' 2"  (1.575 m)   Wt 60.7 kg   SpO2 94%   BMI 24.48 kg/m  Physical Exam Vitals and nursing note reviewed.  Constitutional:      Appearance: Normal  appearance.  HENT:     Head: Normocephalic and atraumatic.     Mouth/Throat:     Mouth: Mucous membranes are moist.  Eyes:     Conjunctiva/sclera: Conjunctivae normal.     Pupils: Pupils are equal, round, and reactive to light.  Cardiovascular:     Rate and Rhythm: Normal rate and regular rhythm.     Pulses: Normal pulses.  Pulmonary:     Effort: Pulmonary effort is normal.     Breath sounds: Normal breath sounds.  Abdominal:     Palpations: Abdomen is soft.     Tenderness: There is no abdominal tenderness.  Musculoskeletal:        General: Tenderness present.     Comments: Tenderness to palpation of the left anterior hip  space.  Patient is unable to lift left leg secondary to pain. ROM and strength of right lower extremity and bilateral feet intact.  Palpable dorsalis pedis pulses bilaterally.  Small abrasion to lateral left elbow without swelling, tenderness, or bony deformity. Strength and ROM of upper extremities intact bilaterally.  Skin:    General: Skin is warm and dry.     Capillary Refill: Capillary refill takes less than 2 seconds.     Findings: Lesion present. No rash.     Comments: Small abrasion to left lateral elbow  Neurological:     General: No focal deficit present.     Mental Status: She is alert.     Sensory: No sensory deficit.  Psychiatric:        Mood and Affect: Mood normal.        Behavior: Behavior normal.    ED Results / Procedures / Treatments   Labs (all labs ordered are listed, but only abnormal results are displayed) Labs Reviewed  BASIC METABOLIC PANEL - Abnormal; Notable for the following components:      Result Value   Glucose, Bld 120 (*)    All other components within normal limits  CBC - Abnormal; Notable for the following components:   WBC 11.6 (*)    All other components within normal limits  MAGNESIUM  PHOSPHORUS  TYPE AND SCREEN  ABO/RH    EKG None  Radiology DG Knee Left Port  Result Date: 03/25/2023 CLINICAL DATA:  Femoral neck fracture. EXAM: PORTABLE LEFT KNEE - 2 VIEW COMPARISON:  None Available. FINDINGS: No fracture or dislocation. Preserved joint spaces and bone mineralization. Hyperostosis along the patella. No joint effusion on lateral view. IMPRESSION: No acute osseous abnormality. Electronically Signed   By: Karen Kays M.D.   On: 03/25/2023 14:33   DG Chest Portable 1 View  Result Date: 03/25/2023 CLINICAL DATA:  Fall.  Cough.  COPD. EXAM: PORTABLE CHEST 1 VIEW COMPARISON:  Radiographs 02/13/2023 and 09/15/2021. FINDINGS: 1332 hours. Lordotic positioning. The heart size and mediastinal contours are normal. There is aortic  atherosclerosis. The lungs are clear. No pleural effusion or pneumothorax. No acute osseous findings are evident. Telemetry leads overlie the chest. IMPRESSION: No evidence of acute chest injury or active cardiopulmonary process. Electronically Signed   By: Carey Bullocks M.D.   On: 03/25/2023 13:59   DG Hip Unilat W or Wo Pelvis 2-3 Views Left  Result Date: 03/25/2023 CLINICAL DATA:  Pain after fall EXAM: DG HIP (WITH OR WITHOUT PELVIS) 3V LEFT COMPARISON:  None Available. FINDINGS: Mildly displaced subcapital left femoral neck fracture. No additional fracture or dislocation. Preserved joint spaces. Osteopenia. Presumed vascular calcifications in the pelvis. Iliac stents. IMPRESSION: Left subcapital femoral  neck fracture. Electronically Signed   By: Karen Kays M.D.   On: 03/25/2023 12:19    Procedures Procedures: not indicated.   Medications Ordered in ED Medications  feeding supplement (ENSURE PRE-SURGERY) liquid 296 mL (has no administration in time range)  fentaNYL (SUBLIMAZE) injection 50 mcg (50 mcg Intravenous Given 03/25/23 1221)  bacitracin ointment ( Topical Given 03/25/23 1426)  HYDROmorphone (DILAUDID) injection 1 mg (1 mg Intravenous Given by Other 03/25/23 1422)    ED Course/ Medical Decision Making/ A&P                                 Medical Decision Making Amount and/or Complexity of Data Reviewed Labs: ordered. Radiology: ordered.  Risk OTC drugs. Prescription drug management. Decision regarding hospitalization.   This patient presents to the ED for concern of left hip pain, this involves an extensive number of treatment options, and is a complaint that carries with it a high risk of complications and morbidity.   Differential diagnosis includes: fracture, dislocation, contusion, hematoma, muscle strain, septic arthritis, etc.   Comorbidities  See HPI above   Additional History  Additional history obtained from prior records.   Cardiac Monitoring /  EKG  The patient was maintained on a cardiac monitor.  I personally viewed and interpreted the cardiac monitored which showed: sinus rhythm with a heart rate of 75 bpm.   Lab Tests  I ordered and personally interpreted labs.  The pertinent results include:   CBC and CMP within normal limits.    Imaging Studies  I ordered imaging studies including left hip x-ray and CXR I independently visualized and interpreted imaging which showed: left subcapital femoral neck fracture. CXR shows no acute cardiopulmonary processes. I agree with the radiologist interpretation   Consultations  I requested consultation with Dr. Jena Gauss with orthopedics,  and discussed lab and imaging findings as well as pertinent plan - they recommend: admission to hospitalist service. She will need a total hip replacement. I requested consultation with  Dr. Robb Matar with [facility],  and discussed lab and imaging findings as well as pertinent plan - they recommend: admission   Problem List / ED Course / Critical Interventions / Medication Management  Left hip pain after fall I ordered medications including: Fentanyl for pain  Reevaluation of the patient after these medicines showed that the patient improved. Pain returned after knee x-ray. I then ordered Dilaudid for pain. I have reviewed the patients home medicines and have made adjustments as needed   Social Determinants of Health  Tobacco use   Test / Admission - Considered  Discussed findings with patient. She is agreeable with the plan.        Final Clinical Impression(s) / ED Diagnoses Final diagnoses:  Closed fracture of neck of left femur, initial encounter [facility])  Fall, initial encounter    Rx / DC Orders ED Discharge Orders     None         Maxwell Marion, PA-C 03/25/23 1536    Derwood Kaplan, MD 03/26/23 [phone]

## 2023-03-26 ENCOUNTER — Inpatient Hospital Stay (HOSPITAL_COMMUNITY): Payer: Medicare HMO

## 2023-03-26 ENCOUNTER — Inpatient Hospital Stay (HOSPITAL_COMMUNITY): Payer: Medicare HMO | Admitting: Anesthesiology

## 2023-03-26 ENCOUNTER — Other Ambulatory Visit: Payer: Self-pay

## 2023-03-26 ENCOUNTER — Encounter (HOSPITAL_COMMUNITY)
Admission: EM | Disposition: A | Discharge status: Home Health | Payer: Self-pay | Source: Home / Self Care | Attending: Internal Medicine

## 2023-03-26 DIAGNOSIS — S72001A Fracture of unspecified part of neck of right femur, initial encounter for closed fracture: Secondary | ICD-10-CM

## 2023-03-26 DIAGNOSIS — S72002A Fracture of unspecified part of neck of left femur, initial encounter for closed fracture: Secondary | ICD-10-CM | POA: Diagnosis not present

## 2023-03-26 DIAGNOSIS — I2510 Atherosclerotic heart disease of native coronary artery without angina pectoris: Secondary | ICD-10-CM | POA: Diagnosis not present

## 2023-03-26 HISTORY — PX: TOTAL HIP ARTHROPLASTY: SHX124

## 2023-03-26 LAB — CBC
HCT: 40 % (ref 36.0–46.0)
Hemoglobin: 12.6 g/dL (ref 12.0–15.0)
MCH: 29.3 pg (ref 26.0–34.0)
MCHC: 31.5 g/dL (ref 30.0–36.0)
MCV: 93 fL (ref 80.0–100.0)
Platelets: 254 10*3/uL (ref 150–400)
RBC: 4.3 MIL/uL (ref 3.87–5.11)
RDW: 13.2 % (ref 11.5–15.5)
WBC: 8.8 10*3/uL (ref 4.0–10.5)
nRBC: 0 % (ref 0.0–0.2)

## 2023-03-26 LAB — ECHOCARDIOGRAM COMPLETE
Area-P 1/2: 3.36 cm2
Height: 62 in
S' Lateral: 3 cm
Weight: 2141.11 [oz_av]

## 2023-03-26 LAB — PROTIME-INR
INR: 1 (ref 0.8–1.2)
Prothrombin Time: 13.1 s (ref 11.4–15.2)

## 2023-03-26 LAB — COMPREHENSIVE METABOLIC PANEL
ALT: 62 U/L — ABNORMAL HIGH (ref 0–44)
AST: 56 U/L — ABNORMAL HIGH (ref 15–41)
Albumin: 3.5 g/dL (ref 3.5–5.0)
Alkaline Phosphatase: 72 U/L (ref 38–126)
Anion gap: 10 (ref 5–15)
BUN: 20 mg/dL (ref 8–23)
CO2: 23 mmol/L (ref 22–32)
Calcium: 9.1 mg/dL (ref 8.9–10.3)
Chloride: 102 mmol/L (ref 98–111)
Creatinine, Ser: 1.01 mg/dL — ABNORMAL HIGH (ref 0.44–1.00)
GFR, Estimated: 60 mL/min — ABNORMAL LOW (ref >60)
Glucose, Bld: 119 mg/dL — ABNORMAL HIGH (ref 70–99)
Potassium: 4.2 mmol/L (ref 3.5–5.1)
Sodium: 135 mmol/L (ref 135–145)
Total Bilirubin: 1.1 mg/dL (ref 0.3–1.2)
Total Protein: 7.2 g/dL (ref 6.5–8.1)

## 2023-03-26 LAB — HIV ANTIBODY (ROUTINE TESTING W REFLEX): HIV Screen 4th Generation wRfx: NONREACTIVE

## 2023-03-26 SURGERY — ARTHROPLASTY, HIP, TOTAL, ANTERIOR APPROACH
Anesthesia: General | Site: Hip | Laterality: Left

## 2023-03-26 MED ORDER — METOCLOPRAMIDE HCL 5 MG PO TABS: 5.0000 mg | ORAL_TABLET | Freq: Three times a day (TID) | ORAL | Status: DC | PRN

## 2023-03-26 MED ORDER — WATER FOR IRRIGATION, STERILE IR SOLN
Status: DC | PRN
  Administered 2023-03-26: 1000 mL

## 2023-03-26 MED ORDER — DEXAMETHASONE SODIUM PHOSPHATE 10 MG/ML IJ SOLN
INTRAMUSCULAR | Status: AC
  Filled 2023-03-26: qty 1

## 2023-03-26 MED ORDER — IPRATROPIUM-ALBUTEROL 0.5-2.5 (3) MG/3ML IN SOLN: 3.0000 mL | RESPIRATORY_TRACT | Status: DC | PRN

## 2023-03-26 MED ORDER — FENTANYL CITRATE (PF) 100 MCG/2ML IJ SOLN
INTRAMUSCULAR | Status: AC
  Filled 2023-03-26: qty 2

## 2023-03-26 MED ORDER — HYDROMORPHONE HCL 2 MG/ML IJ SOLN
INTRAMUSCULAR | Status: AC
  Filled 2023-03-26: qty 1

## 2023-03-26 MED ORDER — PHENOL 1.4 % MT LIQD: 1.0000 | OROMUCOSAL | Status: DC | PRN

## 2023-03-26 MED ORDER — ALBUMIN HUMAN 5 % IV SOLN
INTRAVENOUS | Status: AC
  Filled 2023-03-26: qty 250

## 2023-03-26 MED ORDER — HYDROMORPHONE HCL 1 MG/ML IJ SOLN
INTRAMUSCULAR | Status: AC
  Filled 2023-03-26: qty 1

## 2023-03-26 MED ORDER — POLYETHYLENE GLYCOL 3350 17 G PO PACK: 17.0000 g | PACK | Freq: Every day | ORAL | Status: DC | PRN

## 2023-03-26 MED ORDER — HYDROCODONE-ACETAMINOPHEN 10-325 MG PO TABS
1.0000 | ORAL_TABLET | ORAL | Status: DC | PRN
  Administered 2023-03-27 – 2023-03-28 (×3): 1 via ORAL
  Filled 2023-03-26 (×3): qty 1

## 2023-03-26 MED ORDER — DOCUSATE SODIUM 100 MG PO CAPS
100.0000 mg | ORAL_CAPSULE | Freq: Two times a day (BID) | ORAL | Status: DC
  Administered 2023-03-26 – 2023-03-28 (×5): 100 mg via ORAL
  Filled 2023-03-26 (×5): qty 1

## 2023-03-26 MED ORDER — ROCURONIUM BROMIDE 10 MG/ML (PF) SYRINGE
PREFILLED_SYRINGE | INTRAVENOUS | Status: AC
  Filled 2023-03-26: qty 10

## 2023-03-26 MED ORDER — ONDANSETRON HCL 4 MG/2ML IJ SOLN
INTRAMUSCULAR | Status: DC | PRN
  Administered 2023-03-26: 4 mg via INTRAVENOUS

## 2023-03-26 MED ORDER — ACETAMINOPHEN 500 MG PO TABS
ORAL_TABLET | ORAL | Status: AC
  Filled 2023-03-26: qty 2

## 2023-03-26 MED ORDER — SODIUM CHLORIDE 0.9 % IV SOLN: INTRAVENOUS | Status: DC

## 2023-03-26 MED ORDER — PHENYLEPHRINE HCL-NACL 20-0.9 MG/250ML-% IV SOLN
INTRAVENOUS | Status: AC
  Filled 2023-03-26: qty 250

## 2023-03-26 MED ORDER — ROCURONIUM BROMIDE 100 MG/10ML IV SOLN
INTRAVENOUS | Status: DC | PRN
  Administered 2023-03-26: 10 mg via INTRAVENOUS
  Administered 2023-03-26: 60 mg via INTRAVENOUS

## 2023-03-26 MED ORDER — POVIDONE-IODINE 10 % EX SWAB
2.0000 | Freq: Once | CUTANEOUS | Status: DC
Start: 2023-03-26 — End: 2023-03-26

## 2023-03-26 MED ORDER — PHENYLEPHRINE 80 MCG/ML (10ML) SYRINGE FOR IV PUSH (FOR BLOOD PRESSURE SUPPORT)
PREFILLED_SYRINGE | INTRAVENOUS | Status: AC
  Filled 2023-03-26: qty 10

## 2023-03-26 MED ORDER — DEXMEDETOMIDINE HCL IN NACL 80 MCG/20ML IV SOLN
INTRAVENOUS | Status: AC
  Filled 2023-03-26: qty 20

## 2023-03-26 MED ORDER — ACETAMINOPHEN 325 MG PO TABS: 325.0000 mg | ORAL_TABLET | Freq: Four times a day (QID) | ORAL | Status: DC | PRN

## 2023-03-26 MED ORDER — TRANEXAMIC ACID-NACL 1000-0.7 MG/100ML-% IV SOLN
INTRAVENOUS | Status: AC
  Filled 2023-03-26: qty 100

## 2023-03-26 MED ORDER — BUPIVACAINE-EPINEPHRINE 0.25% -1:200000 IJ SOLN
INTRAMUSCULAR | Status: AC
  Filled 2023-03-26: qty 1

## 2023-03-26 MED ORDER — HYDROCODONE-ACETAMINOPHEN 7.5-325 MG PO TABS
2.0000 | ORAL_TABLET | Freq: Four times a day (QID) | ORAL | Status: DC | PRN
  Administered 2023-03-26 – 2023-03-28 (×2): 2 via ORAL
  Filled 2023-03-26 (×2): qty 2

## 2023-03-26 MED ORDER — ACETAMINOPHEN 500 MG PO TABS
1000.0000 mg | ORAL_TABLET | Freq: Four times a day (QID) | ORAL | Status: AC
  Administered 2023-03-26 – 2023-03-27 (×3): 1000 mg via ORAL
  Filled 2023-03-26 (×5): qty 2

## 2023-03-26 MED ORDER — PHENYLEPHRINE 80 MCG/ML (10ML) SYRINGE FOR IV PUSH (FOR BLOOD PRESSURE SUPPORT)
PREFILLED_SYRINGE | INTRAVENOUS | Status: DC | PRN
  Administered 2023-03-26 (×2): 160 ug via INTRAVENOUS
  Administered 2023-03-26: 80 ug via INTRAVENOUS
  Administered 2023-03-26 (×2): 160 ug via INTRAVENOUS
  Administered 2023-03-26: 80 ug via INTRAVENOUS
  Administered 2023-03-26 (×3): 160 ug via INTRAVENOUS
  Administered 2023-03-26: 80 ug via INTRAVENOUS
  Administered 2023-03-26: 160 ug via INTRAVENOUS

## 2023-03-26 MED ORDER — METOCLOPRAMIDE HCL 5 MG/ML IJ SOLN: 5.0000 mg | Freq: Three times a day (TID) | INTRAMUSCULAR | Status: DC | PRN

## 2023-03-26 MED ORDER — MENTHOL 3 MG MT LOZG: 1.0000 | LOZENGE | OROMUCOSAL | Status: DC | PRN

## 2023-03-26 MED ORDER — FENTANYL CITRATE (PF) 100 MCG/2ML IJ SOLN
INTRAMUSCULAR | Status: DC | PRN
  Administered 2023-03-26 (×2): 50 ug via INTRAVENOUS

## 2023-03-26 MED ORDER — ACETAMINOPHEN 160 MG/5ML PO SOLN
325.0000 mg | Freq: Once | ORAL | Status: DC | PRN
Start: 2023-03-26 — End: 2023-03-26

## 2023-03-26 MED ORDER — ONDANSETRON HCL 4 MG/2ML IJ SOLN
4.0000 mg | Freq: Four times a day (QID) | INTRAMUSCULAR | Status: DC | PRN
  Administered 2023-03-27: 4 mg via INTRAVENOUS
  Filled 2023-03-26: qty 2

## 2023-03-26 MED ORDER — SODIUM CHLORIDE (PF) 0.9 % IJ SOLN
INTRAMUSCULAR | Status: AC
  Filled 2023-03-26: qty 50

## 2023-03-26 MED ORDER — HYDROMORPHONE HCL 1 MG/ML IJ SOLN
0.2500 mg | INTRAMUSCULAR | Status: DC | PRN
Start: 2023-03-26 — End: 2023-03-26
  Administered 2023-03-26: 0.5 mg via INTRAVENOUS

## 2023-03-26 MED ORDER — RIVAROXABAN 2.5 MG PO TABS
2.5000 mg | ORAL_TABLET | Freq: Two times a day (BID) | ORAL | Status: DC
  Administered 2023-03-27 – 2023-03-28 (×3): 2.5 mg via ORAL
  Filled 2023-03-26 (×3): qty 1

## 2023-03-26 MED ORDER — MAGNESIUM CITRATE PO SOLN: 1.0000 | Freq: Once | ORAL | Status: DC | PRN

## 2023-03-26 MED ORDER — ONDANSETRON HCL 4 MG/2ML IJ SOLN
INTRAMUSCULAR | Status: AC
  Filled 2023-03-26: qty 2

## 2023-03-26 MED ORDER — KETOROLAC TROMETHAMINE 30 MG/ML IJ SOLN
INTRAMUSCULAR | Status: AC
  Filled 2023-03-26: qty 1

## 2023-03-26 MED ORDER — LACTATED RINGERS IV SOLN: INTRAVENOUS | Status: DC

## 2023-03-26 MED ORDER — MEPERIDINE HCL 50 MG/ML IJ SOLN: 6.2500 mg | INTRAMUSCULAR | Status: DC | PRN

## 2023-03-26 MED ORDER — PROPOFOL 10 MG/ML IV BOLUS
INTRAVENOUS | Status: DC | PRN
  Administered 2023-03-26: 20 mg via INTRAVENOUS
  Administered 2023-03-26: 30 mg via INTRAVENOUS
  Administered 2023-03-26: 80 mg via INTRAVENOUS

## 2023-03-26 MED ORDER — IPRATROPIUM-ALBUTEROL 0.5-2.5 (3) MG/3ML IN SOLN
3.0000 mL | Freq: Once | RESPIRATORY_TRACT | Status: AC
  Administered 2023-03-26: 3 mL via RESPIRATORY_TRACT

## 2023-03-26 MED ORDER — CEFAZOLIN SODIUM-DEXTROSE 2-4 GM/100ML-% IV SOLN
2.0000 g | INTRAVENOUS | Status: AC
  Administered 2023-03-26: 2 g via INTRAVENOUS

## 2023-03-26 MED ORDER — DROPERIDOL 2.5 MG/ML IJ SOLN: 0.6250 mg | Freq: Once | INTRAMUSCULAR | Status: DC | PRN

## 2023-03-26 MED ORDER — ALBUMIN HUMAN 5 % IV SOLN: INTRAVENOUS | Status: DC | PRN

## 2023-03-26 MED ORDER — ALUM & MAG HYDROXIDE-SIMETH 200-200-20 MG/5ML PO SUSP: 30.0000 mL | ORAL | Status: DC | PRN

## 2023-03-26 MED ORDER — METHOCARBAMOL 500 MG PO TABS
500.0000 mg | ORAL_TABLET | Freq: Four times a day (QID) | ORAL | Status: DC | PRN
  Filled 2023-03-26: qty 1

## 2023-03-26 MED ORDER — IPRATROPIUM-ALBUTEROL 0.5-2.5 (3) MG/3ML IN SOLN
RESPIRATORY_TRACT | Status: AC
  Filled 2023-03-26: qty 3

## 2023-03-26 MED ORDER — SODIUM CHLORIDE 0.9 % IR SOLN
Status: DC | PRN
  Administered 2023-03-26: 1000 mL

## 2023-03-26 MED ORDER — ACETAMINOPHEN 325 MG PO TABS
325.0000 mg | ORAL_TABLET | Freq: Once | ORAL | Status: DC | PRN
Start: 2023-03-26 — End: 2023-03-26

## 2023-03-26 MED ORDER — SODIUM CHLORIDE (PF) 0.9 % IJ SOLN
INTRAMUSCULAR | Status: DC | PRN
  Administered 2023-03-26: 61 mL

## 2023-03-26 MED ORDER — ONDANSETRON HCL 4 MG PO TABS: 4.0000 mg | ORAL_TABLET | Freq: Four times a day (QID) | ORAL | Status: DC | PRN

## 2023-03-26 MED ORDER — SENNA 8.6 MG PO TABS
1.0000 | ORAL_TABLET | Freq: Two times a day (BID) | ORAL | Status: DC
  Administered 2023-03-26 – 2023-03-28 (×5): 8.6 mg via ORAL
  Filled 2023-03-26 (×5): qty 1

## 2023-03-26 MED ORDER — PROPOFOL 1000 MG/100ML IV EMUL
INTRAVENOUS | Status: AC
  Filled 2023-03-26: qty 100

## 2023-03-26 MED ORDER — DIPHENHYDRAMINE HCL 12.5 MG/5ML PO ELIX: 12.5000 mg | ORAL_SOLUTION | ORAL | Status: DC | PRN

## 2023-03-26 MED ORDER — IPRATROPIUM-ALBUTEROL 0.5-2.5 (3) MG/3ML IN SOLN: 3.0000 mL | RESPIRATORY_TRACT | Status: DC

## 2023-03-26 MED ORDER — SORBITOL 70 % SOLN: 30.0000 mL | Freq: Every day | Status: DC | PRN

## 2023-03-26 MED ORDER — CEFAZOLIN SODIUM-DEXTROSE 2-4 GM/100ML-% IV SOLN
2.0000 g | Freq: Four times a day (QID) | INTRAVENOUS | Status: AC
  Administered 2023-03-26 (×2): 2 g via INTRAVENOUS
  Filled 2023-03-26 (×2): qty 100

## 2023-03-26 MED ORDER — DEXMEDETOMIDINE HCL IN NACL 80 MCG/20ML IV SOLN
INTRAVENOUS | Status: DC | PRN
  Administered 2023-03-26: 4 ug via INTRAVENOUS
  Administered 2023-03-26 (×2): 8 ug via INTRAVENOUS

## 2023-03-26 MED ORDER — ISOPROPYL ALCOHOL 70 % SOLN
Status: DC | PRN
  Administered 2023-03-26: 1 via TOPICAL

## 2023-03-26 MED ORDER — METHOCARBAMOL 1000 MG/10ML IJ SOLN: 500.0000 mg | Freq: Four times a day (QID) | INTRAMUSCULAR | Status: DC | PRN

## 2023-03-26 MED ORDER — CHLORHEXIDINE GLUCONATE 4 % EX SOLN
60.0000 mL | Freq: Once | CUTANEOUS | Status: DC
Start: 2023-03-26 — End: 2023-03-26

## 2023-03-26 MED ORDER — LACTATED RINGERS IV SOLN: INTRAVENOUS | Status: DC | PRN

## 2023-03-26 MED ORDER — SUGAMMADEX SODIUM 200 MG/2ML IV SOLN
INTRAVENOUS | Status: DC | PRN
  Administered 2023-03-26: 200 mg via INTRAVENOUS

## 2023-03-26 MED ORDER — LIDOCAINE HCL (PF) 2 % IJ SOLN
INTRAMUSCULAR | Status: AC
  Filled 2023-03-26: qty 5

## 2023-03-26 MED ORDER — HYDROMORPHONE HCL 1 MG/ML IJ SOLN
INTRAMUSCULAR | Status: DC | PRN
  Administered 2023-03-26: .4 mg via INTRAVENOUS

## 2023-03-26 MED ORDER — CEFAZOLIN SODIUM-DEXTROSE 2-4 GM/100ML-% IV SOLN
INTRAVENOUS | Status: AC
  Filled 2023-03-26: qty 100

## 2023-03-26 MED ORDER — HYDROMORPHONE HCL 1 MG/ML IJ SOLN: 0.5000 mg | INTRAMUSCULAR | Status: DC | PRN

## 2023-03-26 MED ORDER — DEXAMETHASONE SODIUM PHOSPHATE 10 MG/ML IJ SOLN
INTRAMUSCULAR | Status: DC | PRN
  Administered 2023-03-26: 6 mg via INTRAVENOUS

## 2023-03-26 MED ORDER — ACETAMINOPHEN 10 MG/ML IV SOLN: 1000.0000 mg | Freq: Once | INTRAVENOUS | Status: DC | PRN

## 2023-03-26 MED ORDER — ALBUMIN HUMAN 5 % IV SOLN
12.5000 g | Freq: Once | INTRAVENOUS | Status: AC
  Administered 2023-03-26: 12.5 g via INTRAVENOUS

## 2023-03-26 MED ORDER — CLOPIDOGREL BISULFATE 75 MG PO TABS
75.0000 mg | ORAL_TABLET | Freq: Every day | ORAL | Status: DC
  Administered 2023-03-27 – 2023-03-28 (×2): 75 mg via ORAL
  Filled 2023-03-26 (×3): qty 1

## 2023-03-26 MED ORDER — RIVAROXABAN 2.5 MG PO TABS
2.5000 mg | ORAL_TABLET | Freq: Every day | ORAL | Status: DC
  Filled 2023-03-26: qty 1

## 2023-03-26 MED ORDER — ACETAMINOPHEN 500 MG PO TABS
1000.0000 mg | ORAL_TABLET | Freq: Once | ORAL | Status: AC
  Administered 2023-03-26: 1000 mg via ORAL

## 2023-03-26 MED ORDER — TRANEXAMIC ACID-NACL 1000-0.7 MG/100ML-% IV SOLN
1000.0000 mg | INTRAVENOUS | Status: AC
  Administered 2023-03-26: 1000 mg via INTRAVENOUS

## 2023-03-26 MED ORDER — LIDOCAINE HCL (CARDIAC) PF 100 MG/5ML IV SOSY
PREFILLED_SYRINGE | INTRAVENOUS | Status: DC | PRN
  Administered 2023-03-26: 60 mg via INTRAVENOUS

## 2023-03-26 MED ORDER — ALBUTEROL SULFATE HFA 108 (90 BASE) MCG/ACT IN AERS
INHALATION_SPRAY | RESPIRATORY_TRACT | Status: DC | PRN
  Administered 2023-03-26: 6 via RESPIRATORY_TRACT

## 2023-03-26 MED ORDER — PHENYLEPHRINE HCL-NACL 20-0.9 MG/250ML-% IV SOLN: 0.0000 ug/min | INTRAVENOUS | Status: DC

## 2023-03-26 SURGICAL SUPPLY — 54 items
ADH SKN CLS APL DERMABOND .7 (GAUZE/BANDAGES/DRESSINGS) ×1
APL PRP STRL LF DISP 70% ISPRP (MISCELLANEOUS) ×1
BAG COUNTER SPONGE SURGICOUNT (BAG) IMPLANT
BAG SPEC THK2 15X12 ZIP CLS (MISCELLANEOUS)
BAG SPNG CNTER NS LX DISP (BAG)
BAG ZIPLOCK 12X15 (MISCELLANEOUS) IMPLANT
CHLORAPREP W/TINT 26 (MISCELLANEOUS) ×1 IMPLANT
COVER PERINEAL POST (MISCELLANEOUS) ×1 IMPLANT
COVER SURGICAL LIGHT HANDLE (MISCELLANEOUS) ×1 IMPLANT
DERMABOND ADVANCED .7 DNX12 (GAUZE/BANDAGES/DRESSINGS) ×2 IMPLANT
DRAPE IMP U-DRAPE 54X76 (DRAPES) ×1 IMPLANT
DRAPE SHEET LG 3/4 BI-LAMINATE (DRAPES) ×3 IMPLANT
DRAPE STERI IOBAN 125X83 (DRAPES) ×1 IMPLANT
DRAPE U-SHAPE 47X51 STRL (DRAPES) ×1 IMPLANT
DRSG AQUACEL AG ADV 3.5X10 (GAUZE/BANDAGES/DRESSINGS) ×1 IMPLANT
ELECT REM PT RETURN 15FT ADLT (MISCELLANEOUS) ×1 IMPLANT
GAUZE SPONGE 4X4 12PLY STRL (GAUZE/BANDAGES/DRESSINGS) ×1 IMPLANT
GLOVE BIO SURGEON STRL SZ7 (GLOVE) ×1 IMPLANT
GLOVE BIO SURGEON STRL SZ8.5 (GLOVE) ×2 IMPLANT
GLOVE BIOGEL PI IND STRL 7.5 (GLOVE) ×1 IMPLANT
GLOVE BIOGEL PI IND STRL 8.5 (GLOVE) ×1 IMPLANT
GOWN SPEC L3 XXLG W/TWL (GOWN DISPOSABLE) ×1 IMPLANT
GOWN STRL REUS W/ TWL XL LVL3 (GOWN DISPOSABLE) ×1 IMPLANT
GOWN STRL REUS W/TWL XL LVL3 (GOWN DISPOSABLE) ×1
HANDPIECE INTERPULSE COAX TIP (DISPOSABLE) ×1
HEAD CERAMIC BIOLOX 36 (Head) IMPLANT
HOLDER FOLEY CATH W/STRAP (MISCELLANEOUS) ×1 IMPLANT
HOOD PEEL AWAY T7 (MISCELLANEOUS) ×3 IMPLANT
KIT TURNOVER KIT A (KITS) IMPLANT
LINER ACETAB ~~LOC~~ D 36 (Liner) IMPLANT
MANIFOLD NEPTUNE II (INSTRUMENTS) ×1 IMPLANT
MARKER SKIN DUAL TIP RULER LAB (MISCELLANEOUS) ×1 IMPLANT
NDL SAFETY ECLIPSE 18X1.5 (NEEDLE) ×1 IMPLANT
NDL SPNL 18GX3.5 QUINCKE PK (NEEDLE) ×1 IMPLANT
NEEDLE SPNL 18GX3.5 QUINCKE PK (NEEDLE) ×1 IMPLANT
PACK ANTERIOR HIP CUSTOM (KITS) ×1 IMPLANT
SAW OSC TIP CART 19.5X105X1.3 (SAW) ×1 IMPLANT
SEALER BIPOLAR AQUA 6.0 (INSTRUMENTS) ×1 IMPLANT
SET HNDPC FAN SPRY TIP SCT (DISPOSABLE) ×1 IMPLANT
SHELL ACET G7 3H 50 SZD (Shell) IMPLANT
SOLUTION PRONTOSAN WOUND 350ML (IRRIGATION / IRRIGATOR) ×1 IMPLANT
SPIKE FLUID TRANSFER (MISCELLANEOUS) ×1 IMPLANT
STEM POROUS COATED 11X142 (Stem) IMPLANT
SUT MNCRL AB 3-0 PS2 18 (SUTURE) ×1 IMPLANT
SUT MON AB 2-0 CT1 36 (SUTURE) ×1 IMPLANT
SUT STRATAFIX PDO 1 14 VIOLET (SUTURE) ×1
SUT STRATFX PDO 1 14 VIOLET (SUTURE) ×1
SUT VIC AB 2-0 CT1 27 (SUTURE)
SUT VIC AB 2-0 CT1 TAPERPNT 27 (SUTURE) IMPLANT
SUTURE STRATFX PDO 1 14 VIOLET (SUTURE) ×1 IMPLANT
SYR 3ML LL SCALE MARK (SYRINGE) ×1 IMPLANT
TRAY FOLEY MTR SLVR 16FR STAT (SET/KITS/TRAYS/PACK) IMPLANT
TUBE SUCTION HIGH CAP CLEAR NV (SUCTIONS) ×1 IMPLANT
WATER STERILE IRR 1000ML POUR (IV SOLUTION) ×1 IMPLANT

## 2023-03-26 NOTE — Anesthesia Preprocedure Evaluation (Signed)
Anesthesia Evaluation  Patient identified by MRN, date of birth, ID band Patient awake    Reviewed: Allergy & Precautions, NPO status , Patient's Chart, lab work & pertinent test results  Airway Mallampati: I  TM Distance: >3 FB Neck ROM: Full    Dental  (+) Edentulous Upper, Edentulous Lower   Pulmonary COPD,  COPD inhaler and oxygen dependent, Current Smoker    + decreased breath sounds      Cardiovascular hypertension, Pt. on medications + CAD and + Peripheral Vascular Disease   Rhythm:Regular Rate:Normal     Neuro/Psych  PSYCHIATRIC DISORDERS       Neuromuscular disease    GI/Hepatic Neg liver ROS,GERD  ,,  Endo/Other  negative endocrine ROS    Renal/GU negative Renal ROS     Musculoskeletal negative musculoskeletal ROS (+)    Abdominal   Peds  Hematology negative hematology ROS (+)   Anesthesia Other Findings   Reproductive/Obstetrics                             Anesthesia Physical Anesthesia Plan  ASA: 3  Anesthesia Plan: General   Post-op Pain Management: Ofirmev IV (intra-op)*   Induction: Intravenous  PONV Risk Score and Plan: 3 and Ondansetron, Dexamethasone and Midazolam  Airway Management Planned: Oral ETT  Additional Equipment: None  Intra-op Plan:   Post-operative Plan: Extubation in OR  Informed Consent: I have reviewed the patients History and Physical, chart, labs and discussed the procedure including the risks, benefits and alternatives for the proposed anesthesia with the patient or authorized representative who has indicated his/her understanding and acceptance.     Dental advisory given  Plan Discussed with: CRNA  Anesthesia Plan Comments:        Anesthesia Quick Evaluation

## 2023-03-26 NOTE — Discharge Instructions (Signed)

## 2023-03-26 NOTE — Consult Note (Addendum)
ORTHOPAEDIC CONSULTATION  REQUESTING PHYSICIAN: Barnetta Chapel, MD  PCP:  Pcp, No  Chief Complaint: left hip injury  HPI: Rachel Ewing is a 72 y.o. female with a past medical history significant of hyperlipidemia, hypertension, CAD, PAD, history of stress-induced cardiomyopathy, COPD, active tobacco use, GERD who had a fall at home while she was trying to walk her dogs landing on her left hip area, developing immediate pain and inability to bear weight.  She came to the emergency department at [facility], where x-rays revealed a displaced left femoral neck fracture.  She was admitted to the hospitalist service for perioperative restratification and medical optimization.  Orthopedic consultation was placed for management of her left hip fracture.  Her last dose of Xarelto was on 03/24/2023.  Past Medical History:  Diagnosis Date   CAD (coronary artery disease) 10/24/2011   COPD, moderate (HCC) 06/05/2015   Essential hypertension 02/21/2011   Gastroesophageal reflux disease without esophagitis 07/23/2012   High cholesterol    Hyperlipidemia 03/25/2023   Hypertension    PAD (peripheral artery disease) (HCC) 03/25/2023   Peripheral neuropathy 03/25/2023   Restless leg syndrome 03/25/2023   Past Surgical History:  Procedure Laterality Date   AORTOGRAM     Social History   Socioeconomic History   Marital status: Divorced    Spouse name: Not on file   Number of children: Not on file   Years of education: Not on file   Highest education level: Not on file  Occupational History   Not on file  Tobacco Use   Smoking status: Every Day    Types: Cigarettes   Smokeless tobacco: Never  Vaping Use   Vaping status: Never Used  Substance and Sexual Activity   Alcohol use: Not Currently   Drug use: Never   Sexual activity: Not on file  Other Topics Concern   Not on file  Social History Narrative   Not on file   Social Determinants of Health   Financial Resource  Strain: Low Risk  (06/05/2022)   Received from [facility]   Overall Financial Resource Strain (CARDIA)    Difficulty of Paying Living Expenses: Not very hard  Recent Concern: Financial Resource Strain - High Risk (05/27/2022)   Received from [facility]   Overall Financial Resource Strain (CARDIA)    Difficulty of Paying Living Expenses: Very hard  Food Insecurity: No Food Insecurity (03/25/2023)   Hunger Vital Sign    Worried About Running Out of Food in the Last Year: Never true    Ran Out of Food in the Last Year: Never true  Transportation Needs: No Transportation Needs (03/25/2023)   PRAPARE - Administrator, Civil Service (Medical): No    Lack of Transportation (Non-Medical): No  Physical Activity: Unknown (11/25/2021)   Received from [facility]   Exercise Vital Sign    Days of Exercise per Week: 0 days    Minutes of Exercise per Session: Not on file  Recent Concern: Physical Activity - Inactive (11/25/2021)   Received from [facility]   Exercise Vital Sign    Days of Exercise per Week: 0 days    Minutes of Exercise per Session: 10 min  Stress: No Stress Concern Present (06/05/2022)   Received from [facility] - Occupational Stress Questionnaire    Feeling of Stress : Not at all  Social Connections: Unknown (11/26/2022)   Received from [facility]  Social Network    Social Network: Not on file   History reviewed. No pertinent family history. Allergies  Allergen Reactions   Azithromycin Itching   Codeine Nausea Only and Other (See Comments)    Causes stomach pain     Penicillins Other (See Comments) and Rash    Tolerates cephalosporins Cause stomach pain    Aspirin     Can Take 81 mg but not 325 mg. GI Upset (intolerance) Nausea Only   Tramadol Nausea Only   Cyclobenzaprine Nausea Only   Prednisone Nausea Only and Other (See Comments)    Low dose is ok Can only take 10mg  of prednisone-  gives her shakes     Prior to Admission medications   Medication Sig Start Date End Date Taking? Authorizing Provider  HYDROcodone-acetaminophen (NORCO/VICODIN) 5-325 MG tablet Take 2 tablets by mouth 2 (two) times daily. 01/28/22  Yes [provider]  LINZESS 72 MCG capsule Take 72 mcg by mouth every morning. 10/26/21  Yes [provider]  lisinopril (ZESTRIL) 10 MG tablet Take 10 mg by mouth daily. 11/08/21  Yes [provider]  rosuvastatin (CRESTOR) 20 MG tablet Take 20 mg by mouth at bedtime. 08/07/21  Yes [provider]  sertraline (ZOLOFT) 50 MG tablet Take 50 mg by mouth every morning. 01/13/23  Yes [provider]  TRELEGY ELLIPTA 100-62.5-25 MCG/ACT AEPB Inhale 1 puff into the lungs daily as needed (sob/wheezing). 08/07/21  Yes [provider]  albuterol (PROVENTIL) (2.5 MG/3ML) 0.083% nebulizer solution Take 2.5 mg by nebulization every 6 (six) hours as needed for wheezing or shortness of breath. 08/30/21   [provider]  clopidogrel (PLAVIX) 75 MG tablet Take 75 mg by mouth every morning. 01/13/23   [provider]  fluconazole (DIFLUCAN) 150 MG tablet Take 150 mg by mouth every 3 (three) days. 01/16/23   [provider]  FLUoxetine (PROZAC) 20 MG capsule Take 20 mg by mouth daily. 10/26/21   [provider]  gabapentin (NEURONTIN) 300 MG capsule Take 300 mg by mouth daily as needed (pain). Patient not taking: Reported on 02/24/2023 10/26/21   [provider]  nystatin cream (MYCOSTATIN) Apply 1 Application topically 2 (two) times daily. Patient not taking: Reported on 02/24/2023 03/29/22   [provider]  pregabalin (LYRICA) 25 MG capsule Take 25 mg by mouth 3 (three) times daily as needed. 12/13/22   [provider]  rOPINIRole (REQUIP) 1 MG tablet Take 1 mg by mouth at bedtime. 11/08/21   [provider]  tiZANidine (ZANAFLEX) 4 MG tablet Take 4 mg by mouth at bedtime.  08/30/21   [provider]  XARELTO 2.5 MG TABS tablet Take 2.5 mg by mouth in the morning. 08/07/21   [provider]   DG Knee Left Port  Result Date: 03/25/2023 CLINICAL DATA:  Femoral neck fracture. EXAM: PORTABLE LEFT KNEE - 2 VIEW COMPARISON:  None Available. FINDINGS: No fracture or dislocation. Preserved joint spaces and bone mineralization. Hyperostosis along the patella. No joint effusion on lateral view. IMPRESSION: No acute osseous abnormality. Electronically Signed   By: Karen Kays M.D.   On: 03/25/2023 14:33   DG Chest Portable 1 View  Result Date: 03/25/2023 CLINICAL DATA:  Fall.  Cough.  COPD. EXAM: PORTABLE CHEST 1 VIEW COMPARISON:  Radiographs 02/13/2023 and 09/15/2021. FINDINGS: 1332 hours. Lordotic positioning. The heart size and mediastinal contours are normal. There is aortic atherosclerosis. The lungs are clear. No pleural effusion or pneumothorax. No acute osseous  findings are evident. Telemetry leads overlie the chest. IMPRESSION: No evidence of acute chest injury or active cardiopulmonary process. Electronically Signed   By: Carey Bullocks M.D.   On: 03/25/2023 13:59   DG Hip Unilat W or Wo Pelvis 2-3 Views Left  Result Date: 03/25/2023 CLINICAL DATA:  Pain after fall EXAM: DG HIP (WITH OR WITHOUT PELVIS) 3V LEFT COMPARISON:  None Available. FINDINGS: Mildly displaced subcapital left femoral neck fracture. No additional fracture or dislocation. Preserved joint spaces. Osteopenia. Presumed vascular calcifications in the pelvis. Iliac stents. IMPRESSION: Left subcapital femoral neck fracture. Electronically Signed   By: Karen Kays M.D.   On: 03/25/2023 12:19    Positive ROS: All other systems have been reviewed and were otherwise negative with the exception of those mentioned in the HPI and as above.  Physical Exam: General: Alert, no acute distress Cardiovascular: No pedal edema Respiratory: No cyanosis, no use of accessory musculature GI: No  organomegaly, abdomen is soft and non-tender Skin: No lesions in the area of chief complaint Neurologic: Sensation intact distally Psychiatric: Patient is competent for consent with normal mood and affect Lymphatic: No axillary or cervical lymphadenopathy  MUSCULOSKELETAL: Examination of the left hip reveals no skin wounds or lesions.  She is shortened and externally rotated.  Pain with logrolling of the hip.  She has stocking glove sensory change consistent with neuropathy.  Her foot is perfused.  Dopplerable DP pulse with biphasic signal.  Positive motor function dorsiflexion, plantarflexion, and great toe extension.  Assessment: Displaced left femoral neck fracture. Peripheral arterial disease, right ABI 0.9 and left ABI 0.89. Chronic Xarelto usage. Peripheral neuropathy. COPD. Coronary artery disease. Tobacco abuse.   Plan: I discussed the findings with the patient and her family.  We went over the nature of her injury.  She has a displaced left femoral neck fracture.  I recommend left total hip arthroplasty for pain control and immediate mobilization out of bed.  We discussed the risk, benefits, and alternatives.  Please see statement of risk.  Recommend tobacco cessation to reduce the risk of wound healing complications and periprosthetic joint infection.  Plan for surgery today.  Continue NPO.  Continue to hold Xarelto.  All questions were solicited and answered.  The risks, benefits, and alternatives were discussed with the patient. There are risks associated with the surgery including, but not limited to, problems with anesthesia (death), infection, instability (giving out of the joint), dislocation, differences in leg length/angulation/rotation, fracture of bones, loosening or failure of implants, hematoma (blood accumulation) which may require surgical drainage, blood clots, pulmonary embolism, nerve injury (foot drop and lateral thigh numbness), and blood vessel injury. The patient  understands these risks and elects to proceed.  Jonette Pesa, MD [phone]    03/26/2023 8:10 AM

## 2023-03-26 NOTE — Anesthesia Procedure Notes (Signed)
Procedure Name: Intubation Date/Time: 03/26/2023 8:23 AM  Performed by: Burgess Estelle, CRNAPre-anesthesia Checklist: Patient identified, Emergency Drugs available, Suction available and Patient being monitored Patient Re-evaluated:Patient Re-evaluated prior to induction Oxygen Delivery Method: Circle System Utilized Preoxygenation: Pre-oxygenation with 100% oxygen Induction Type: IV induction Ventilation: Mask ventilation without difficulty Laryngoscope Size: Mac and 3 Grade View: Grade I Tube type: Oral Tube size: 7.0 mm Number of attempts: 1 Airway Equipment and Method: Stylet and Oral airway Placement Confirmation: ETT inserted through vocal cords under direct vision, positive ETCO2 and breath sounds checked- equal and bilateral Secured at: 22 cm Tube secured with: Tape Dental Injury: Teeth and Oropharynx as per pre-operative assessment

## 2023-03-26 NOTE — Progress Notes (Signed)
PROGRESS NOTE    Rachel Ewing  ZOX:[id number] DOB: [birth year] DOA: 03/25/2023 PCP: Pcp, No  Outpatient Specialists:     Brief Narrative:  Patient is a 72 year old female with past medical history significant for hyperlipidemia, hypertension, CAD, PAD, history of stress-induced cardiomyopathy, COPD, active tobacco use and GERD.  Patient was admitted with left hip fracture following a fall.  Patient underwent total left hip arthroplasty earlier today.  Orthopedic team is directing postop care.  03/26/2023: Patient seen.  Patient is very hard of hearing.  Pain is controlled.   Assessment & Plan:   Principal Problem:   Closed fracture of left hip (HCC) Active Problems:   CAD (coronary artery disease)   COPD, moderate (HCC)   Essential hypertension   Gastroesophageal reflux disease without esophagitis   Hyperlipidemia   Restless leg syndrome   PAD (peripheral artery disease) (HCC)   Peripheral neuropathy   Closed fracture of left hip (HCC) -See above documentation. -Patient underwent left total hip arthroplasty earlier today. -Orthopedic team is directing care. -Xarelto 2.5 Mg p.o. twice daily to start tomorrow.   -PT OT. -Adequate pain control.    CAD (coronary artery disease) -Stable.   COPD, moderate (HCC): Stable. Pulmicort 0.5 mg twice daily. DuoNebs 4 times a day. Continue Trelegy Ellipta twice daily once improved.     Essential hypertension -Hold lisinopril. -Blood pressure is on the low side. -Continue to monitor closely.   -Minimize medications control blood pressure. -Volume resuscitation as needed.     Gastroesophageal reflux disease without esophagitis     Hyperlipidemia Continue rosuvastatin 20 mg p.o. daily.     Restless leg syndrome Not sure if taking requisite.     PAD (peripheral artery disease) (HCC) Has been off Xarelto for 36 hours. Discussed with orthopedic surgery.     Peripheral neuropathy Currently off gabapentin.  DVT  prophylaxis: Xarelto is currently on hold, but will resume tomorrow. Code Status: Full code. Family Communication:  Disposition Plan: This will depend on hospital course.   Consultants:  Orthopedic surgery.  Procedures:  Left total hip arthroplasty.  Antimicrobials:  IV cefazolin.   Subjective: No new complaints.  Objective: Vitals:   03/26/23 1625 03/26/23 1639 03/26/23 1657 03/26/23 1745  BP:  114/66 126/65 (!) 87/64  Pulse:  80 94 76  Resp:  16 17 15   Temp:    97.8 F (36.6 C)  TempSrc:      SpO2: 90% 95% 95% 94%  Weight:      Height:        Intake/Output Summary (Last 24 hours) at 03/26/2023 1753 Last data filed at 03/26/2023 1622 Gross per 24 hour  Intake 2951.4 ml  Output 200 ml  Net 2751.4 ml   Filed Weights   03/25/23 1051  Weight: 60.7 kg    Examination:  General exam: Appears calm and comfortable.  Very hard of hearing. Respiratory system: Clear to auscultation.  Cardiovascular system: S1 & S2 heard Gastrointestinal system: Abdomen is soft and nontender.  Central nervous system: Alert and oriented.  Extremities:   Data Reviewed: I have personally reviewed following labs and imaging studies  CBC: Recent Labs  Lab 03/25/23 1401 03/26/23 0331  WBC 11.6* 8.8  HGB 12.3 12.6  HCT 38.4 40.0  MCV 90.6 93.0  PLT 245 254   Basic Metabolic Panel: Recent Labs  Lab 03/25/23 1401 03/26/23 0331  NA 138 135  K 4.1 4.2  CL 108 102  CO2 24 23  GLUCOSE 120* 119*  BUN 17  20  CREATININE 0.93 1.01*  CALCIUM 8.9 9.1  MG 1.9  --   PHOS 2.7  --    GFR: Estimated Creatinine Clearance: 44.4 mL/min (A) (by C-G formula based on SCr of 1.01 mg/dL (H)). Liver Function Tests: Recent Labs  Lab 03/26/23 0331  AST 56*  ALT 62*  ALKPHOS 72  BILITOT 1.1  PROT 7.2  ALBUMIN 3.5   No results for input(s): "LIPASE", "AMYLASE" in the last 168 hours. No results for input(s): "AMMONIA" in the last 168 hours. Coagulation Profile: Recent Labs  Lab  03/26/23 0331  INR 1.0   Cardiac Enzymes: No results for input(s): "CKTOTAL", "CKMB", "CKMBINDEX", "TROPONINI" in the last 168 hours. BNP (last 3 results) No results for input(s): "PROBNP" in the last 8760 hours. HbA1C: No results for input(s): "HGBA1C" in the last 72 hours. CBG: No results for input(s): "GLUCAP" in the last 168 hours. Lipid Profile: No results for input(s): "CHOL", "HDL", "LDLCALC", "TRIG", "CHOLHDL", "LDLDIRECT" in the last 72 hours. Thyroid Function Tests: No results for input(s): "TSH", "T4TOTAL", "FREET4", "T3FREE", "THYROIDAB" in the last 72 hours. Anemia Panel: No results for input(s): "VITAMINB12", "FOLATE", "FERRITIN", "TIBC", "IRON", "RETICCTPCT" in the last 72 hours. Urine analysis: No results found for: "COLORURINE", "APPEARANCEUR", "LABSPEC", "PHURINE", "GLUCOSEU", "HGBUR", "BILIRUBINUR", "KETONESUR", "PROTEINUR", "UROBILINOGEN", "NITRITE", "LEUKOCYTESUR" Sepsis Labs: @LABRCNTIP (procalcitonin:4,lacticidven:4)  )No results found for this or any previous visit (from the past 240 hour(s)).       Radiology Studies: ECHOCARDIOGRAM COMPLETE  Result Date: 03/26/2023    ECHOCARDIOGRAM REPORT   Patient Name:   Rachel Ewing Date of Exam: 03/26/2023 Medical Rec #:  [id number]    Height:       62.0 in Accession #:    [id number]   Weight:       133.8 lb Date of Birth:  03-Jul-1950    BSA:          1.612 m Patient Age:    72 years     BP:           110/87 mmHg Patient Gender: F            HR:           64 bpm. Exam Location:  Inpatient Procedure: 2D Echo, Color Doppler and Cardiac Doppler Indications:    CAD Native Vessel I25.10, Pre op  History:        Patient has no prior history of Echocardiogram examinations.                 CAD; COPD.  Sonographer:    Harriette Bouillon RDCS Referring Phys: [phone] DAVID MANUEL ORTIZ IMPRESSIONS  1. Left ventricular ejection fraction, by estimation, is 65 to 70%. The left ventricle has normal function. The left ventricle has no regional  wall motion abnormalities. Left ventricular diastolic parameters are consistent with Grade I diastolic dysfunction (impaired relaxation).  2. Right ventricular systolic function is normal. The right ventricular size is normal. Tricuspid regurgitation signal is inadequate for assessing PA pressure.  3. The mitral valve is grossly normal. Trivial mitral valve regurgitation.  4. The aortic valve is tricuspid. Aortic valve regurgitation is not visualized.  5. The inferior vena cava is normal in size with <50% respiratory variability, suggesting right atrial pressure of 8 mmHg. Comparison(s): No prior Echocardiogram. FINDINGS  Left Ventricle: Left ventricular ejection fraction, by estimation, is 65 to 70%. The left ventricle has normal function. The left ventricle has no regional wall motion abnormalities. The left ventricular internal cavity size was  normal in size. There is  no left ventricular hypertrophy. Left ventricular diastolic parameters are consistent with Grade I diastolic dysfunction (impaired relaxation). Right Ventricle: The right ventricular size is normal. No increase in right ventricular wall thickness. Right ventricular systolic function is normal. Tricuspid regurgitation signal is inadequate for assessing PA pressure. Left Atrium: Left atrial size was normal in size. Right Atrium: Right atrial size was normal in size. Pericardium: There is no evidence of pericardial effusion. Presence of epicardial fat layer. Mitral Valve: The mitral valve is grossly normal. Trivial mitral valve regurgitation. Tricuspid Valve: The tricuspid valve is grossly normal. Tricuspid valve regurgitation is mild. Aortic Valve: The aortic valve is tricuspid. There is mild aortic valve annular calcification. Aortic valve regurgitation is not visualized. Pulmonic Valve: The pulmonic valve was not well visualized. Pulmonic valve regurgitation is trivial. Aorta: The aortic root and ascending aorta are structurally normal, with no  evidence of dilitation. Venous: The inferior vena cava is normal in size with less than 50% respiratory variability, suggesting right atrial pressure of 8 mmHg. IAS/Shunts: The interatrial septum was not well visualized.  LEFT VENTRICLE PLAX 2D LVIDd:         4.40 cm   Diastology LVIDs:         3.00 cm   LV e' medial:    6.42 cm/s LV PW:         0.90 cm   LV E/e' medial:  13.2 LV IVS:        0.90 cm   LV e' lateral:   6.20 cm/s LVOT diam:     1.80 cm   LV E/e' lateral: 13.6 LV SV:         63 LV SV Index:   39 LVOT Area:     2.54 cm  RIGHT VENTRICLE            IVC RV S prime:     8.81 cm/s  IVC diam: 1.60 cm TAPSE (M-mode): 2.1 cm LEFT ATRIUM             Index        RIGHT ATRIUM           Index LA diam:        3.10 cm 1.92 cm/m   RA Area:     10.30 cm LA Vol (A2C):   54.2 ml 33.63 ml/m  RA Volume:   18.60 ml  11.54 ml/m LA Vol (A4C):   50.6 ml 31.40 ml/m LA Biplane Vol: 55.9 ml 34.69 ml/m  AORTIC VALVE LVOT Vmax:   128.00 cm/s LVOT Vmean:  78.700 cm/s LVOT VTI:    0.249 m  AORTA Ao Root diam: 3.00 cm Ao Asc diam:  2.50 cm MITRAL VALVE MV Area (PHT): 3.36 cm    SHUNTS MV Decel Time: 226 msec    Systemic VTI:  0.25 m MV E velocity: 84.60 cm/s  Systemic Diam: 1.80 cm MV A velocity: 96.10 cm/s MV E/A ratio:  0.88 Nona Dell MD Electronically signed by Nona Dell MD Signature Date/Time: 03/26/2023/4:22:58 PM    Final    DG HIP UNILAT WITH PELVIS 1V LEFT  Result Date: 03/26/2023 CLINICAL DATA:  [id number] Surgery, elective [id number] EXAM: DG HIP (WITH OR WITHOUT PELVIS) 1V*L* COMPARISON:  03/25/2023 FINDINGS: Two C-arm fluoroscopic images were obtained intraoperatively and submitted for post operative interpretation. Images demonstrate left total hip arthroplasty hardware in place. 9 seconds of fluoroscopy time utilized. Radiation dose: 1.1 mGy. Please see the performing provider's procedural report  for further detail. IMPRESSION: Intraoperative fluoroscopy for left total hip arthroplasty. Electronically  Signed   By: Duanne Guess D.O.   On: 03/26/2023 12:11   DG Pelvis Portable  Result Date: 03/26/2023 CLINICAL DATA:  [id number] Closed displaced fracture of left femoral neck (HCC) [id number] EXAM: PORTABLE PELVIS 1-2 VIEWS COMPARISON:  03/25/2023 FINDINGS: Interval postsurgical changes from left total hip arthroplasty. Arthroplasty components are in their expected alignment. No periprosthetic fracture or evidence of other complication. Expected postoperative changes within the overlying soft tissues. IMPRESSION: Interval postsurgical changes from left total hip arthroplasty without evidence of complication. Electronically Signed   By: Duanne Guess D.O.   On: 03/26/2023 12:10   DG C-Arm 1-60 Min-No Report  Result Date: 03/26/2023 Fluoroscopy was utilized by the requesting physician.  No radiographic interpretation.   DG C-Arm 1-60 Min-No Report  Result Date: 03/26/2023 Fluoroscopy was utilized by the requesting physician.  No radiographic interpretation.   DG Knee Left Port  Result Date: 03/25/2023 CLINICAL DATA:  Femoral neck fracture. EXAM: PORTABLE LEFT KNEE - 2 VIEW COMPARISON:  None Available. FINDINGS: No fracture or dislocation. Preserved joint spaces and bone mineralization. Hyperostosis along the patella. No joint effusion on lateral view. IMPRESSION: No acute osseous abnormality. Electronically Signed   By: Karen Kays M.D.   On: 03/25/2023 14:33   DG Chest Portable 1 View  Result Date: 03/25/2023 CLINICAL DATA:  Fall.  Cough.  COPD. EXAM: PORTABLE CHEST 1 VIEW COMPARISON:  Radiographs 02/13/2023 and 09/15/2021. FINDINGS: 1332 hours. Lordotic positioning. The heart size and mediastinal contours are normal. There is aortic atherosclerosis. The lungs are clear. No pleural effusion or pneumothorax. No acute osseous findings are evident. Telemetry leads overlie the chest. IMPRESSION: No evidence of acute chest injury or active cardiopulmonary process. Electronically Signed   By: Carey Bullocks M.D.   On: 03/25/2023 13:59   DG Hip Unilat W or Wo Pelvis 2-3 Views Left  Result Date: 03/25/2023 CLINICAL DATA:  Pain after fall EXAM: DG HIP (WITH OR WITHOUT PELVIS) 3V LEFT COMPARISON:  None Available. FINDINGS: Mildly displaced subcapital left femoral neck fracture. No additional fracture or dislocation. Preserved joint spaces. Osteopenia. Presumed vascular calcifications in the pelvis. Iliac stents. IMPRESSION: Left subcapital femoral neck fracture. Electronically Signed   By: Karen Kays M.D.   On: 03/25/2023 12:19        Scheduled Meds:  acetaminophen       acetaminophen  1,000 mg Oral Q6H   [START ON 03/27/2023] clopidogrel  75 mg Oral Daily   docusate sodium  100 mg Oral BID   HYDROmorphone       ipratropium-albuterol  3 mL Nebulization QID   ipratropium-albuterol       linaclotide  72 mcg Oral q morning   lisinopril  10 mg Oral Daily   phenylephrine       [START ON 03/27/2023] rivaroxaban  2.5 mg Oral BID   rosuvastatin  20 mg Oral QHS   senna  1 tablet Oral BID   sertraline  50 mg Oral q morning   Continuous Infusions:  sodium chloride 150 mL/hr at 03/26/23 1226   albumin human      ceFAZolin (ANCEF) IV 2 g (03/26/23 1256)     LOS: 1 day    Time spent: 35 minutes.    Berton Mount, MD  [facility] Pager #: [phone] 7PM-7AM contact night coverage as above

## 2023-03-26 NOTE — Transfer of Care (Signed)
Immediate Anesthesia Transfer of Care Note  Patient: Aveya Beal  Procedure(s) Performed: TOTAL HIP ARTHROPLASTY ANTERIOR APPROACH (Left: Hip)  Patient Location: PACU  Anesthesia Type:General  Level of Consciousness: responds to stimulation  Airway & Oxygen Therapy: Patient Spontanous Breathing and Patient connected to face mask  Post-op Assessment: Report given to RN and Post -op Vital signs reviewed and stable  Post vital signs: Reviewed and stable  Last Vitals:  Vitals Value Taken Time  BP 98/60 03/26/23 1103  Temp 37.1 C 03/26/23 1056  Pulse 70 03/26/23 1104  Resp 8 03/26/23 1104  SpO2 100 % 03/26/23 1104  Vitals shown include unfiled device data.  Last Pain:  Vitals:   03/26/23 1056  TempSrc:   PainSc: Asleep      Patients Stated Pain Goal: 3 (03/26/23 0330)  Complications: No notable events documented.

## 2023-03-26 NOTE — Plan of Care (Signed)

## 2023-03-26 NOTE — Anesthesia Postprocedure Evaluation (Signed)
Anesthesia Post Note  Patient: Rachel Ewing  Procedure(s) Performed: TOTAL HIP ARTHROPLASTY ANTERIOR APPROACH (Left: Hip)     Patient location during evaluation: PACU Anesthesia Type: General Level of consciousness: awake and alert Pain management: pain level controlled Vital Signs Assessment: post-procedure vital signs reviewed and stable Respiratory status: spontaneous breathing, nonlabored ventilation, respiratory function stable and patient connected to nasal cannula oxygen Cardiovascular status: blood pressure returned to baseline and stable Postop Assessment: no apparent nausea or vomiting Anesthetic complications: no  No notable events documented.  Last Vitals:  Vitals:   03/26/23 1200 03/26/23 1209  BP: 102/63 108/89  Pulse: 71 70  Resp: 12 12  Temp: 36.7 C   SpO2: 94% 95%    Last Pain:  Vitals:   03/26/23 1209  TempSrc:   PainSc: 3                  Shelton Silvas

## 2023-03-26 NOTE — Op Note (Addendum)
OPERATIVE REPORT  SURGEON: Samson Frederic, MD   ASSISTANT: Staff.  PREOPERATIVE DIAGNOSIS: Displaced Left femoral neck fracture.   POSTOPERATIVE DIAGNOSIS: Displaced Left femoral neck fracture.   PROCEDURE: Left total hip arthroplasty, anterior approach.   IMPLANTS: Biomet Taperloc Reduced Distal stem, size 11 x 142 mm, high offset. Biomet G7 OsseoTi Cup, size 50 mm. Biomet Vivacit-E liner, size 36 mm, D, neutral. Biomet Biolox ceramic head ball, size 36 - 3 mm.  ANESTHESIA:  General  ANTIBIOTICS: 2g ancef.  ESTIMATED BLOOD LOSS:-200 mL    DRAINS: None.  COMPLICATIONS: None   CONDITION: PACU - hemodynamically stable.   BRIEF CLINICAL NOTE: Rachel Ewing is a 72 y.o. female with a displaced Left femoral neck fracture.  She has a history of peripheral arterial disease status post aortobifemoral bypass and left CFA/profunda thrombectomy with patch angioplasty, peripheral neuropathy on oxycodone 5 mg, COPD, and tobacco abuse.  She takes Xarelto and Plavix daily for PAD.  The patient was admitted to the hospitalist service and underwent perioperative risk stratification and medical optimization. The risks, benefits, and alternatives to total hip arthroplasty were explained, and the patient elected to proceed.  PROCEDURE IN DETAIL: The patient was taken to the operating room and general anesthesia was induced on the hospital bed.  The patient was then positioned on the Hana table.  All bony prominences were well padded.  The hip was prepped and draped in the normal sterile surgical fashion.  A time-out was called verifying side and site of surgery. Antibiotics were given within 60 minutes of beginning the procedure.   Bikini incision was made, and the direct anterior approach to the hip was performed through the Hueter interval.  Lateral femoral circumflex vessels were treated with the Auqumantys. The anterior capsule was exposed and an inverted T capsulotomy was made.  Fracture hematoma  was encountered and evacuated. The patient was found to have a comminuted Left subcapital femoral neck fracture.  I freshened the femoral neck cut with a saw.  I removed the femoral neck fragment.  A corkscrew was placed into the head and the head was removed.  This was passed to the back table and was measured. The pubofemoral ligament was released subperiosteally to the lesser trochanter.  Acetabular exposure was achieved, and the pulvinar and labrum were excised. Sequential reaming of the acetabulum was then performed up to a size 49 mm reamer under direct visulization. A 50 mm cup was then opened and impacted into place at approximately 40 degrees of abduction and 20 degrees of anteversion. The final polyethylene liner was impacted into place and acetabular osteophytes were removed.    I then gained femoral exposure taking care to protect the abductors and greater trochanter.  This was performed using standard external rotation, extension, and adduction.  A cookie cutter was used to enter the femoral canal, and then the femoral canal finder was placed.  Sequential broaching was performed up to a size 11.  Calcar planer was used on the femoral neck remnant.  I placed a high offset neck and a trial head ball.  The hip was reduced.  Leg lengths and offset were checked fluoroscopically.  The hip was dislocated and trial components were removed.  The final implants were placed, and the hip was reduced.  Fluoroscopy was used to confirm component position and leg lengths.  At 90 degrees of external rotation and full extension, the hip was stable to an anterior directed force.   The wound was copiously irrigated with Prontosan  solution and normal saline using pulse lavage.  Marcaine solution was injected into the periarticular soft tissue.  The wound was closed in layers using #1 Stratafix for the fascia, 2-0 Vicryl for the subcutaneous fat, 2-0 Monocryl for the deep dermal layer, and staples + Dermabond for the  skin.  Once the glue was fully dried, an Aquacell Ag dressing was applied.  The patient was transported to the recovery room in stable condition.  Sponge, needle, and instrument counts were correct at the end of the case x2.  The patient tolerated the procedure well and there were no known complications.  The aquamantis was utilized for this case to help facilitate better hemostasis as patient was felt to be at increased risk of bleeding because of recent anticoagulation use.  A oscillating saw tip was utilized for this case to prevent damage to the soft tissue structures such as muscles, ligaments and tendons, and to ensure accurate bone cuts. This patient was at increased risk for above structures due to  minimally invasive approach.  POSTOPERATIVE PLAN: The patient will be readmitted to the hospitalist service.  Weightbearing as tolerated left lower extremity with a walker.  Routine prophylactic IV antibiotics.  Tomorrow morning, resume Xarelto 2.5 mg BID and Plavix 75 mg daily (per [facility] records).  Mobilize out of bed with PT/OT.  Patient will undergo disposition planning.  She will follow-up in the office for routine 2-week postoperative care.

## 2023-03-27 ENCOUNTER — Encounter (HOSPITAL_COMMUNITY): Payer: Self-pay | Admitting: Orthopedic Surgery

## 2023-03-27 DIAGNOSIS — S72002A Fracture of unspecified part of neck of left femur, initial encounter for closed fracture: Secondary | ICD-10-CM | POA: Diagnosis not present

## 2023-03-27 LAB — BASIC METABOLIC PANEL
Anion gap: 7 (ref 5–15)
BUN: 22 mg/dL (ref 8–23)
CO2: 22 mmol/L (ref 22–32)
Calcium: 7.7 mg/dL — ABNORMAL LOW (ref 8.9–10.3)
Chloride: 106 mmol/L (ref 98–111)
Creatinine, Ser: 1.28 mg/dL — ABNORMAL HIGH (ref 0.44–1.00)
GFR, Estimated: 45 mL/min — ABNORMAL LOW (ref >60)
Glucose, Bld: 136 mg/dL — ABNORMAL HIGH (ref 70–99)
Potassium: 3.9 mmol/L (ref 3.5–5.1)
Sodium: 135 mmol/L (ref 135–145)

## 2023-03-27 LAB — CBC
HCT: 26.4 % — ABNORMAL LOW (ref 36.0–46.0)
Hemoglobin: 8.2 g/dL — ABNORMAL LOW (ref 12.0–15.0)
MCH: 29.5 pg (ref 26.0–34.0)
MCHC: 31.1 g/dL (ref 30.0–36.0)
MCV: 95 fL (ref 80.0–100.0)
Platelets: 161 10*3/uL (ref 150–400)
RBC: 2.78 MIL/uL — ABNORMAL LOW (ref 3.87–5.11)
RDW: 13.1 % (ref 11.5–15.5)
WBC: 7.2 10*3/uL (ref 4.0–10.5)
nRBC: 0 % (ref 0.0–0.2)

## 2023-03-27 MED ORDER — HYDROCODONE-ACETAMINOPHEN 5-325 MG PO TABS: 1.0000 | ORAL_TABLET | ORAL | 0 refills | Status: AC | PRN

## 2023-03-27 MED ORDER — ADULT MULTIVITAMIN W/MINERALS CH
1.0000 | ORAL_TABLET | Freq: Every day | ORAL | Status: DC
  Administered 2023-03-28: 1 via ORAL
  Filled 2023-03-27: qty 1

## 2023-03-27 NOTE — Progress Notes (Addendum)
    Subjective:  Patient reports pain as mild to moderate.  Denies N/V/CP/SOB/Abd pain. Reports mild anterior thigh pain. She reports numbness in LLE per baseline. She is eager for d/c home.   Objective:   VITALS:   Vitals:   03/26/23 1745 03/26/23 2008 03/27/23 0020 03/27/23 0352  BP: (!) 87/64 (!) 104/46 (!) 95/50 (!) 94/51  Pulse: 76 77 69 79  Resp: 15 18 18    Temp: 97.8 F (36.6 C) 98.6 F (37 C) 98.1 F (36.7 C) 98.3 F (36.8 C)  TempSrc:  Oral Oral Oral  SpO2: 94% 95% 98% 95%  Weight:      Height:        Patient sitting up in bed. NAD.  Neurologically intact ABD soft Neurovascular intact Intact pulses distally Dorsiflexion/Plantar flexion intact Incision: dressing C/D/I No cellulitis present Compartment soft Reports numbness in LLE per baseline. Motor function intact.   Lab Results  Component Value Date   WBC 7.2 03/27/2023   HGB 8.2 (L) 03/27/2023   HCT 26.4 (L) 03/27/2023   MCV 95.0 03/27/2023   PLT 161 03/27/2023   BMET    Component Value Date/Time   NA 135 03/27/2023 0320   K 3.9 03/27/2023 0320   CL 106 03/27/2023 0320   CO2 22 03/27/2023 0320   GLUCOSE 136 (H) 03/27/2023 0320   BUN 22 03/27/2023 0320   CREATININE 1.28 (H) 03/27/2023 0320   CALCIUM 7.7 (L) 03/27/2023 0320   GFRNONAA 45 (L) 03/27/2023 0320     Assessment/Plan: 1 Day Post-Op   Principal Problem:   Closed fracture of left hip (HCC) Active Problems:   CAD (coronary artery disease)   COPD, moderate (HCC)   Essential hypertension   Gastroesophageal reflux disease without esophagitis   Hyperlipidemia   Restless leg syndrome   PAD (peripheral artery disease) (HCC)   Peripheral neuropathy   WBAT with walker DVT ppx: Xarelto 2.5mg  BID and plavix, SCDs, TEDS PO pain control PT/OT: PT to come by today.  Dispo:  - Patient under care of the medical team disposition per their recommendation. Pain medication printed in chart.    Clois Dupes, PA-C 03/27/2023, 8:11  AM  [facility]  [facility] [address], [address], [place], [place] [address] Phone: [phone] [email or web address] Facebook  [employer]

## 2023-03-27 NOTE — Evaluation (Signed)
Physical Therapy Evaluation Patient Details Name: Rachel Ewing MRN: [id number] DOB: Mar 01, 1951 Today's Date: 03/27/2023  History of Present Illness  72 yo female admitted with L hip fx. S/P L THA 03/26/23. Hx of falls, neuropathy, COPD, RLS, CAD, PAD  Clinical Impression  On eval, pt was CGA-Min A for mobility. She walked ~75 feet with a RW. Minimal pain with activity. O2 87% on RA with ambulation. Will plan to follow and progress activity as tolerated. Pt/family asking about getting a wheelchair-made TOC team aware. Recommend HHPT and RW.         If plan is discharge home, recommend the following: A little help with walking and/or transfers;A little help with bathing/dressing/bathroom;Assistance with cooking/housework;Assist for transportation;Help with stairs or ramp for entrance   Can travel by private vehicle        Equipment Recommendations Rolling walker (2 wheels)  Recommendations for Other Services       Functional Status Assessment Patient has had a recent decline in their functional status and demonstrates the ability to make significant improvements in function in a reasonable and predictable amount of time.     Precautions / Restrictions Precautions Precautions: Fall Precaution Comments: monitor O2 Restrictions Weight Bearing Restrictions: No LLE Weight Bearing: Weight bearing as tolerated      Mobility  Bed Mobility Overal bed mobility: Needs Assistance Bed Mobility: Supine to Sit, Sit to Supine     Supine to sit: Contact guard, HOB elevated, Used rails Sit to supine: Min assist   General bed mobility comments: Small amount of assist for L LE onto bed. Increased time.    Transfers Overall transfer level: Needs assistance Equipment used: Rolling walker (2 wheels) Transfers: Sit to/from Stand Sit to Stand: Contact guard assist           General transfer comment: Cuse for safety, hand placement. Increased time.    Ambulation/Gait Ambulation/Gait  assistance: Contact guard assist Gait Distance (Feet): 75 Feet Assistive device: Rolling walker (2 wheels) Gait Pattern/deviations: Step-through pattern, Trunk flexed, Decreased stride length       General Gait Details: CGA for safety. Slow, effortful gait . O2 87% on RA, Dyspnea 2/4  Stairs            Wheelchair Mobility     Tilt Bed    Modified Rankin (Stroke Patients Only)       Balance Overall balance assessment: Needs assistance         Standing balance support: Reliant on assistive device for balance, Bilateral upper extremity supported, During functional activity Standing balance-Leahy Scale: Fair                               Pertinent Vitals/Pain Pain Assessment Pain Assessment: Faces Faces Pain Scale: Hurts little more Pain Location: L hip/LE Pain Descriptors / Indicators: Discomfort Pain Intervention(s): Limited activity within patient's tolerance, Monitored during session, Repositioned, Ice applied    Home Living Family/patient expects to be discharged to:: Private residence Living Arrangements: Spouse/significant other Available Help at Discharge: Family Type of Home: House Home Access: Stairs to enter Entrance Stairs-Rails: Right Entrance Stairs-Number of Steps: 2   Home Layout: One level Home Equipment: Firefighter      Prior Function Prior Level of Function : Independent/Modified Independent                     Extremity/Trunk Assessment   Upper Extremity Assessment Upper Extremity Assessment: Defer to  OT evaluation    Lower Extremity Assessment Lower Extremity Assessment: Generalized weakness    Cervical / Trunk Assessment Cervical / Trunk Assessment: Normal  Communication   Communication Communication: Hearing impairment  Cognition Arousal: Alert Behavior During Therapy: WFL for tasks assessed/performed Overall Cognitive Status: Within Functional Limits for tasks assessed                                           General Comments      Exercises Total Joint Exercises Ankle Circles/Pumps: AROM, Both, 10 reps Quad Sets: AROM, Left, 10 reps Heel Slides: AAROM, Left, 10 reps   Assessment/Plan    PT Assessment Patient needs continued PT services  PT Problem List Decreased strength;Decreased range of motion;Decreased activity tolerance;Decreased balance;Decreased mobility;Decreased knowledge of use of DME;Pain       PT Treatment Interventions DME instruction;Gait training;Stair training;Functional mobility training;Therapeutic activities;Therapeutic exercise;Balance training;Patient/family education    PT Goals (Current goals can be found in the Care Plan section)  Acute Rehab PT Goals Patient Stated Goal: to be able to go to [facility] PT Goal Formulation: With patient/family Time For Goal Achievement: 04/10/23 Potential to Achieve Goals: Good    Frequency 7X/week     Co-evaluation               AM-PAC PT "6 Clicks" Mobility  Outcome Measure Help needed turning from your back to your side while in a flat bed without using bedrails?: A Little Help needed moving from lying on your back to sitting on the side of a flat bed without using bedrails?: A Little Help needed moving to and from a bed to a chair (including a wheelchair)?: A Little Help needed standing up from a chair using your arms (e.g., wheelchair or bedside chair)?: A Little Help needed to walk in hospital room?: A Little Help needed climbing 3-5 steps with a railing? : A Little 6 Click Score: 18    End of Session Equipment Utilized During Treatment: Gait belt Activity Tolerance: Patient tolerated treatment well Patient left: in bed;with call bell/phone within reach;with bed alarm set;with family/visitor present   PT Visit Diagnosis: Pain;Other abnormalities of gait and mobility (R26.89) Pain - Right/Left: Left Pain - part of body: Leg    Time: 0930-1001 PT Time Calculation  (min) (ACUTE ONLY): 31 min   Charges:   PT Evaluation $PT Eval Low Complexity: 1 Low PT Treatments $Gait Training: 8-22 mins PT General Charges $$ ACUTE PT VISIT: 1 Visit            Faye Ramsay, PT Acute Rehabilitation  Office: [phone]

## 2023-03-27 NOTE — Progress Notes (Signed)
PROGRESS NOTE    Rachel Ewing  UVO:[id number] DOB: 21-Sep-1950 DOA: 03/25/2023 PCP: Pcp, No    Brief Narrative:  Patient is a 72 year old female with past medical history significant for hyperlipidemia, hypertension, CAD, PAD, history of stress-induced cardiomyopathy, COPD, active tobacco use and GERD.  Patient was admitted with left hip fracture following a fall.  Patient underwent total left hip arthroplasty 11/3.  Postop day 1.  Appropriately improving.   Assessment & Plan:   Principal Problem:   Closed fracture of left hip (HCC) Active Problems:   CAD (coronary artery disease)   COPD, moderate (HCC)   Essential hypertension   Gastroesophageal reflux disease without esophagitis   Hyperlipidemia   Restless leg syndrome   PAD (peripheral artery disease) (HCC)   Peripheral neuropathy   Closed fracture of left hip (HCC) -Status post left total hip arthroplasty, Dr. Linna Caprice 11/3 -Xarelto 2.5 Mg p.o. twice daily and Plavix for DVT prophylaxis. -Weightbearing as tolerated. -PT OT.  She will benefit with home health PT OT.  Will order. -Adequate pain control along with bowel regimen.  Anemia due to acute blood loss: Anticipated from long bone fracture and surgery.  Hemoglobin 12-8.2.  Recheck tomorrow morning.  CAD (coronary artery disease) -Stable.  Resume Plavix.   COPD, moderate (HCC): Stable. Pulmicort 0.5 mg twice daily. DuoNebs 4 times a day. Continue Trelegy Ellipta twice daily once discharged.     Essential hypertension -Hold lisinopril. -Blood pressure is fairly stable.  Gastroesophageal reflux disease without esophagitis, on PPI     Hyperlipidemia Continue rosuvastatin 20 mg p.o. daily.     PAD (peripheral artery disease) (HCC) Resuming Xarelto.     Peripheral neuropathy Currently off gabapentin.  DVT prophylaxis: Xarelto is currently on hold, but will resume tomorrow. Code Status: Full code. Family Communication:  Disposition Plan: Anticipate home  tomorrow.   Consultants:  Orthopedic surgery.  Procedures:  Left total hip arthroplasty.  Antimicrobials:  IV cefazolin, perioperative.   Subjective: Patient seen and examined.  Her nephew was at the bedside.  Patient denies any complaints.  She is eager to go home today.  Patient does have sensory loss on her left lower extremity.  Pain is controlled.  Objective: Vitals:   03/26/23 1745 03/26/23 2008 03/27/23 0020 03/27/23 0352  BP: (!) 87/64 (!) 104/46 (!) 95/50 (!) 94/51  Pulse: 76 77 69 79  Resp: 15 18 18    Temp: 97.8 F (36.6 C) 98.6 F (37 C) 98.1 F (36.7 C) 98.3 F (36.8 C)  TempSrc:  Oral Oral Oral  SpO2: 94% 95% 98% 95%  Weight:      Height:        Intake/Output Summary (Last 24 hours) at 03/27/2023 1234 Last data filed at 03/27/2023 1119 Gross per 24 hour  Intake 2393.75 ml  Output 700 ml  Net 1693.75 ml   Filed Weights   03/25/23 1051  Weight: 60.7 kg    Examination:  General exam: Appears calm and comfortable.  Pleasant to conversation. Respiratory system: Clear to auscultation.  Cardiovascular system: S1 & S2 heard Gastrointestinal system: Abdomen is soft and nontender.  Central nervous system: Alert and oriented.  Extremities: Left thigh incisions clean and dry.  Minimal swelling.  Data Reviewed: I have personally reviewed following labs and imaging studies  CBC: Recent Labs  Lab 03/25/23 1401 03/26/23 0331 03/27/23 0539  WBC 11.6* 8.8 7.2  HGB 12.3 12.6 8.2*  HCT 38.4 40.0 26.4*  MCV 90.6 93.0 95.0  PLT 245 254 161  Basic Metabolic Panel: Recent Labs  Lab 03/25/23 1401 03/26/23 0331 03/27/23 0320  NA 138 135 135  K 4.1 4.2 3.9  CL 108 102 106  CO2 24 23 22   GLUCOSE 120* 119* 136*  BUN 17 20 22   CREATININE 0.93 1.01* 1.28*  CALCIUM 8.9 9.1 7.7*  MG 1.9  --   --   PHOS 2.7  --   --    GFR: Estimated Creatinine Clearance: 35.1 mL/min (A) (by C-G formula based on SCr of 1.28 mg/dL (H)). Liver Function Tests: Recent  Labs  Lab 03/26/23 0331  AST 56*  ALT 62*  ALKPHOS 72  BILITOT 1.1  PROT 7.2  ALBUMIN 3.5   No results for input(s): "LIPASE", "AMYLASE" in the last 168 hours. No results for input(s): "AMMONIA" in the last 168 hours. Coagulation Profile: Recent Labs  Lab 03/26/23 0331  INR 1.0   Cardiac Enzymes: No results for input(s): "CKTOTAL", "CKMB", "CKMBINDEX", "TROPONINI" in the last 168 hours. BNP (last 3 results) No results for input(s): "PROBNP" in the last 8760 hours. HbA1C: No results for input(s): "HGBA1C" in the last 72 hours. CBG: No results for input(s): "GLUCAP" in the last 168 hours. Lipid Profile: No results for input(s): "CHOL", "HDL", "LDLCALC", "TRIG", "CHOLHDL", "LDLDIRECT" in the last 72 hours. Thyroid Function Tests: No results for input(s): "TSH", "T4TOTAL", "FREET4", "T3FREE", "THYROIDAB" in the last 72 hours. Anemia Panel: No results for input(s): "VITAMINB12", "FOLATE", "FERRITIN", "TIBC", "IRON", "RETICCTPCT" in the last 72 hours. Urine analysis: No results found for: "COLORURINE", "APPEARANCEUR", "LABSPEC", "PHURINE", "GLUCOSEU", "HGBUR", "BILIRUBINUR", "KETONESUR", "PROTEINUR", "UROBILINOGEN", "NITRITE", "LEUKOCYTESUR" Sepsis Labs: @LABRCNTIP (procalcitonin:4,lacticidven:4)  )No results found for this or any previous visit (from the past 240 hour(s)).       Radiology Studies: ECHOCARDIOGRAM COMPLETE  Result Date: 03/26/2023    ECHOCARDIOGRAM REPORT   Patient Name:   Rachel Ewing Date of Exam: 03/26/2023 Medical Rec #:  [id number]    Height:       62.0 in Accession #:    [id number]   Weight:       133.8 lb Date of Birth:  [birth year]    BSA:          1.612 m Patient Age:    72 years     BP:           110/87 mmHg Patient Gender: F            HR:           64 bpm. Exam Location:  Inpatient Procedure: 2D Echo, Color Doppler and Cardiac Doppler Indications:    CAD Native Vessel I25.10, Pre op  History:        Patient has no prior history of Echocardiogram  examinations.                 CAD; COPD.  Sonographer:    Harriette Bouillon RDCS Referring Phys: [id number] DAVID MANUEL ORTIZ IMPRESSIONS  1. Left ventricular ejection fraction, by estimation, is 65 to 70%. The left ventricle has normal function. The left ventricle has no regional wall motion abnormalities. Left ventricular diastolic parameters are consistent with Grade I diastolic dysfunction (impaired relaxation).  2. Right ventricular systolic function is normal. The right ventricular size is normal. Tricuspid regurgitation signal is inadequate for assessing PA pressure.  3. The mitral valve is grossly normal. Trivial mitral valve regurgitation.  4. The aortic valve is tricuspid. Aortic valve regurgitation is not visualized.  5. The inferior vena cava is normal in size with <  50% respiratory variability, suggesting right atrial pressure of 8 mmHg. Comparison(s): No prior Echocardiogram. FINDINGS  Left Ventricle: Left ventricular ejection fraction, by estimation, is 65 to 70%. The left ventricle has normal function. The left ventricle has no regional wall motion abnormalities. The left ventricular internal cavity size was normal in size. There is  no left ventricular hypertrophy. Left ventricular diastolic parameters are consistent with Grade I diastolic dysfunction (impaired relaxation). Right Ventricle: The right ventricular size is normal. No increase in right ventricular wall thickness. Right ventricular systolic function is normal. Tricuspid regurgitation signal is inadequate for assessing PA pressure. Left Atrium: Left atrial size was normal in size. Right Atrium: Right atrial size was normal in size. Pericardium: There is no evidence of pericardial effusion. Presence of epicardial fat layer. Mitral Valve: The mitral valve is grossly normal. Trivial mitral valve regurgitation. Tricuspid Valve: The tricuspid valve is grossly normal. Tricuspid valve regurgitation is mild. Aortic Valve: The aortic valve is tricuspid.  There is mild aortic valve annular calcification. Aortic valve regurgitation is not visualized. Pulmonic Valve: The pulmonic valve was not well visualized. Pulmonic valve regurgitation is trivial. Aorta: The aortic root and ascending aorta are structurally normal, with no evidence of dilitation. Venous: The inferior vena cava is normal in size with less than 50% respiratory variability, suggesting right atrial pressure of 8 mmHg. IAS/Shunts: The interatrial septum was not well visualized.  LEFT VENTRICLE PLAX 2D LVIDd:         4.40 cm   Diastology LVIDs:         3.00 cm   LV e' medial:    6.42 cm/s LV PW:         0.90 cm   LV E/e' medial:  13.2 LV IVS:        0.90 cm   LV e' lateral:   6.20 cm/s LVOT diam:     1.80 cm   LV E/e' lateral: 13.6 LV SV:         63 LV SV Index:   39 LVOT Area:     2.54 cm  RIGHT VENTRICLE            IVC RV S prime:     8.81 cm/s  IVC diam: 1.60 cm TAPSE (M-mode): 2.1 cm LEFT ATRIUM             Index        RIGHT ATRIUM           Index LA diam:        3.10 cm 1.92 cm/m   RA Area:     10.30 cm LA Vol (A2C):   54.2 ml 33.63 ml/m  RA Volume:   18.60 ml  11.54 ml/m LA Vol (A4C):   50.6 ml 31.40 ml/m LA Biplane Vol: 55.9 ml 34.69 ml/m  AORTIC VALVE LVOT Vmax:   128.00 cm/s LVOT Vmean:  78.700 cm/s LVOT VTI:    0.249 m  AORTA Ao Root diam: 3.00 cm Ao Asc diam:  2.50 cm MITRAL VALVE MV Area (PHT): 3.36 cm    SHUNTS MV Decel Time: 226 msec    Systemic VTI:  0.25 m MV E velocity: 84.60 cm/s  Systemic Diam: 1.80 cm MV A velocity: 96.10 cm/s MV E/A ratio:  0.88 Nona Dell MD Electronically signed by Nona Dell MD Signature Date/Time: 03/26/2023/4:22:58 PM    Final    DG HIP UNILAT WITH PELVIS 1V LEFT  Result Date: 03/26/2023 CLINICAL DATA:  [id number] Surgery, elective [id number] EXAM:  DG HIP (WITH OR WITHOUT PELVIS) 1V*L* COMPARISON:  03/25/2023 FINDINGS: Two C-arm fluoroscopic images were obtained intraoperatively and submitted for post operative interpretation. Images demonstrate left  total hip arthroplasty hardware in place. 9 seconds of fluoroscopy time utilized. Radiation dose: 1.1 mGy. Please see the performing provider's procedural report for further detail. IMPRESSION: Intraoperative fluoroscopy for left total hip arthroplasty. Electronically Signed   By: Duanne Guess D.O.   On: 03/26/2023 12:11   DG Pelvis Portable  Result Date: 03/26/2023 CLINICAL DATA:  [id number] Closed displaced fracture of left femoral neck (HCC) [id number] EXAM: PORTABLE PELVIS 1-2 VIEWS COMPARISON:  03/25/2023 FINDINGS: Interval postsurgical changes from left total hip arthroplasty. Arthroplasty components are in their expected alignment. No periprosthetic fracture or evidence of other complication. Expected postoperative changes within the overlying soft tissues. IMPRESSION: Interval postsurgical changes from left total hip arthroplasty without evidence of complication. Electronically Signed   By: Duanne Guess D.O.   On: 03/26/2023 12:10   DG C-Arm 1-60 Min-No Report  Result Date: 03/26/2023 Fluoroscopy was utilized by the requesting physician.  No radiographic interpretation.   DG C-Arm 1-60 Min-No Report  Result Date: 03/26/2023 Fluoroscopy was utilized by the requesting physician.  No radiographic interpretation.   DG Knee Left Port  Result Date: 03/25/2023 CLINICAL DATA:  Femoral neck fracture. EXAM: PORTABLE LEFT KNEE - 2 VIEW COMPARISON:  None Available. FINDINGS: No fracture or dislocation. Preserved joint spaces and bone mineralization. Hyperostosis along the patella. No joint effusion on lateral view. IMPRESSION: No acute osseous abnormality. Electronically Signed   By: Karen Kays M.D.   On: 03/25/2023 14:33   DG Chest Portable 1 View  Result Date: 03/25/2023 CLINICAL DATA:  Fall.  Cough.  COPD. EXAM: PORTABLE CHEST 1 VIEW COMPARISON:  Radiographs 02/13/2023 and 09/15/2021. FINDINGS: 1332 hours. Lordotic positioning. The heart size and mediastinal contours are normal. There is  aortic atherosclerosis. The lungs are clear. No pleural effusion or pneumothorax. No acute osseous findings are evident. Telemetry leads overlie the chest. IMPRESSION: No evidence of acute chest injury or active cardiopulmonary process. Electronically Signed   By: Carey Bullocks M.D.   On: 03/25/2023 13:59        Scheduled Meds:  clopidogrel  75 mg Oral Daily   docusate sodium  100 mg Oral BID   linaclotide  72 mcg Oral q morning   rivaroxaban  2.5 mg Oral BID   rosuvastatin  20 mg Oral QHS   senna  1 tablet Oral BID   sertraline  50 mg Oral q morning   Continuous Infusions:     LOS: 2 days    Time spent: 35 minutes.

## 2023-03-27 NOTE — Progress Notes (Signed)
OT Cancellation Note  Patient Details Name: Rachel Ewing MRN: [id number] DOB: 16-Mar-1951   Cancelled Treatment:    Reason Eval/Treat Not Completed: Medical issues which prohibited therapy (pt with nausea and vomiting, will follow.)  Evern Bio 03/27/2023, 11:06 AM Berna Spare, OTR/L Acute Rehabilitation Services Office: [phone]

## 2023-03-27 NOTE — Evaluation (Signed)
Occupational Therapy Evaluation Patient Details Name: Rachel Ewing MRN: [id number] DOB: January 26, 1951 Today's Date: 03/27/2023   History of Present Illness 72 yo female admitted with L hip fx. S/P L THA 03/26/23. Hx of falls, neuropathy, COPD, RLS, CAD, PAD   Clinical Impression   Pt is typically independent. Presents with L thigh soreness, decreased activity tolerance requiring frequent rest breaks and impaired standing balance. Pt mobilizes OOB to bathroom with CGA and RW. She needs set up to moderate assistance for ADLs. Pt owns a Sports administrator, educated in availability of a sock aid, pt prefers to rely on her significant other. Recommended a long handled bath sponge. Pt plans to sponge bathe initially. Encouraged use of IS as pt is currently O2 dependent. Recommending HHOT, pt agreeable.       If plan is discharge home, recommend the following: A little help with walking and/or transfers;A lot of help with bathing/dressing/bathroom;Assistance with cooking/housework;Help with stairs or ramp for entrance;Assist for transportation    Functional Status Assessment  Patient has had a recent decline in their functional status and demonstrates the ability to make significant improvements in function in a reasonable and predictable amount of time.  Equipment Recommendations  BSC/3in1    Recommendations for Other Services       Precautions / Restrictions Precautions Precautions: Fall Precaution Comments: monitor O2 Restrictions Weight Bearing Restrictions: No LLE Weight Bearing: Weight bearing as tolerated      Mobility Bed Mobility Overal bed mobility: Modified Independent             General bed mobility comments: no assist, increased time, HOB up    Transfers Overall transfer level: Needs assistance Equipment used: Rolling walker (2 wheels) Transfers: Sit to/from Stand Sit to Stand: Contact guard assist           General transfer comment: slow to rise      Balance  Overall balance assessment: Needs assistance   Sitting balance-Leahy Scale: Good       Standing balance-Leahy Scale: Fair Standing balance comment: can release walker in static standing                           ADL either performed or assessed with clinical judgement   ADL Overall ADL's : Needs assistance/impaired Eating/Feeding: Independent;Sitting   Grooming: Wash/dry hands;Standing;Contact guard assist   Upper Body Bathing: Set up;Sitting   Lower Body Bathing: Moderate assistance;Sit to/from stand   Upper Body Dressing : Set up;Sitting   Lower Body Dressing: Moderate assistance;Sit to/from stand   Toilet Transfer: Contact guard assist;Rolling walker (2 wheels);BSC/3in1;Ambulation   Toileting- Clothing Manipulation and Hygiene: Set up;Sitting/lateral lean       Functional mobility during ADLs: Contact guard assist;Rolling walker (2 wheels)       Vision Baseline Vision/History: 1 Wears glasses Ability to See in Adequate Light: 0 Adequate Patient Visual Report: No change from baseline       Perception         Praxis         Pertinent Vitals/Pain Pain Assessment Pain Assessment: Faces Faces Pain Scale: Hurts a little bit Pain Location: L upper thigh Pain Descriptors / Indicators: Sore Pain Intervention(s): Ice applied, Premedicated before session     Extremity/Trunk Assessment Upper Extremity Assessment Upper Extremity Assessment: Overall WFL for tasks assessed   Lower Extremity Assessment Lower Extremity Assessment: Defer to PT evaluation   Cervical / Trunk Assessment Cervical / Trunk Assessment: Normal   Communication Communication  Communication: Hearing impairment   Cognition Arousal: Alert Behavior During Therapy: WFL for tasks assessed/performed Overall Cognitive Status: Within Functional Limits for tasks assessed                                       General Comments       Exercises     Shoulder  Instructions      Home Living Family/patient expects to be discharged to:: Private residence Living Arrangements: Spouse/significant other Available Help at Discharge: Family Type of Home: House Home Access: Stairs to enter Secretary/administrator of Steps: 2 Entrance Stairs-Rails: Right Home Layout: One level     Bathroom Shower/Tub: Chief Strategy Officer: Standard     Home Equipment: Firefighter          Prior Functioning/Environment Prior Level of Function : Independent/Modified Independent                        OT Problem List: Decreased strength;Impaired balance (sitting and/or standing);Decreased activity tolerance;Cardiopulmonary status limiting activity      OT Treatment/Interventions: Self-care/ADL training;DME and/or AE instruction;Therapeutic activities;Balance training;Patient/family education    OT Goals(Current goals can be found in the care plan section) Acute Rehab OT Goals OT Goal Formulation: With patient Time For Goal Achievement: 04/10/23 Potential to Achieve Goals: Good ADL Goals Pt Will Perform Grooming: with modified independence;standing Pt Will Perform Lower Body Bathing: sit to/from stand;with adaptive equipment;with supervision Pt Will Perform Lower Body Dressing: with supervision;sit to/from stand;with adaptive equipment Pt Will Transfer to Toilet: with supervision;ambulating;bedside commode (over toilet) Pt Will Perform Toileting - Clothing Manipulation and hygiene: with supervision;sit to/from stand  OT Frequency: Min 1X/week    Co-evaluation              AM-PAC OT "6 Clicks" Daily Activity     Outcome Measure Help from another person eating meals?: None Help from another person taking care of personal grooming?: A Little Help from another person toileting, which includes using toliet, bedpan, or urinal?: A Little Help from another person bathing (including washing, rinsing, drying)?: A Lot Help from  another person to put on and taking off regular upper body clothing?: A Little Help from another person to put on and taking off regular lower body clothing?: A Lot 6 Click Score: 17   End of Session Equipment Utilized During Treatment: Gait belt;Rolling walker (2 wheels)  Activity Tolerance: Patient tolerated treatment well Patient left: in chair;with call bell/phone within reach  OT Visit Diagnosis: Unsteadiness on feet (R26.81);Other abnormalities of gait and mobility (R26.89);Pain;Muscle weakness (generalized) (M62.81);Other (comment) (decreased activity tolerance)                Time: 1610-9604 OT Time Calculation (min): 24 min Charges:  OT General Charges $OT Visit: 1 Visit OT Evaluation $OT Eval Moderate Complexity: 1 Mod OT Treatments $Self Care/Home Management : 8-22 mins  Berna Spare, OTR/L Acute Rehabilitation Services Office: [phone]   Evern Bio 03/27/2023, 3:23 PM

## 2023-03-27 NOTE — Progress Notes (Signed)
Initial Nutrition Assessment  DOCUMENTATION CODES:   Not applicable  INTERVENTION:  - Heart Healthy diet per MD.  - Magic cup BID  with meals, each supplement provides 290 kcal and 9 grams of protein - Multivitamin with minerals daily - Monitor weight trends.   NUTRITION DIAGNOSIS:   Increased nutrient needs related to hip fracture as evidenced by estimated needs.  GOAL:   Patient will meet greater than or equal to 90% of their needs  MONITOR:   PO intake, Supplement acceptance, Weight trends  REASON FOR ASSESSMENT:   Consult Hip fracture protocol  ASSESSMENT:   72 year old female with PMH significant for HLD, HTN, CAD, PAD, COPD, active tobacco use and GERD. Patient was admitted with left hip fracture following a fall.  Patient reports a UBW of 133# and denies any changes in weight.  Typically eats 1-2 meals a day at home, just "depends on the day".  Current appetite is good and patient reports eating well since admit. Documented to be consuming 70-98% of meals. Doesn't like traditional ONS but agreeable to try Magic Cup.    Medications reviewed and include: Colace, Senokot  Labs reviewed:  Creatinine 1.28   NUTRITION - FOCUSED PHYSICAL EXAM:  Flowsheet Row Most Recent Value  Orbital Region No depletion  Upper Arm Region No depletion  Thoracic and Lumbar Region No depletion  Buccal Region No depletion  Temple Region No depletion  Clavicle Bone Region No depletion  Clavicle and Acromion Bone Region No depletion  Scapular Bone Region Unable to assess  Dorsal Hand No depletion  Patellar Region No depletion  Anterior Thigh Region No depletion  Posterior Calf Region No depletion  Edema (RD Assessment) None  Hair Reviewed  Eyes Reviewed  Mouth Reviewed  Skin Reviewed  Nails Reviewed       Diet Order:   Diet Order             Diet Heart Room service appropriate? Yes; Fluid consistency: Thin  Diet effective now                   EDUCATION  NEEDS:  Education needs have been addressed  Skin:  Skin Assessment: Reviewed RN Assessment  Last BM:  11/1  Height:  Ht Readings from Last 1 Encounters:  03/25/23 5\' 2"  (1.575 m)   Weight:  Wt Readings from Last 1 Encounters:  03/25/23 60.7 kg    BMI:  Body mass index is 24.48 kg/m.  Estimated Nutritional Needs:  Kcal:  1700-1800 kcals Protein:  75-90 grams Fluid:  >/= 1.7L    Shelle Iron RD, LDN For contact information, refer to [facility].

## 2023-03-27 NOTE — Progress Notes (Signed)
Physical Therapy Treatment Patient Details Name: Rachel Ewing MRN: [id number] DOB: May 29, 1950 Today's Date: 03/27/2023   History of Present Illness 72 yo female admitted with L hip fx. S/P L THA 03/26/23. Hx of falls, neuropathy, COPD, RLS, CAD, PAD    PT Comments  O2 86% on RA with activity. Pt experiencing some nausea during session. Will continue to follow and progress activity. Plan is for d/c home on tomorrow if medically ready.   If plan is discharge home, recommend the following: A little help with walking and/or transfers;A little help with bathing/dressing/bathroom;Assistance with cooking/housework;Assist for transportation;Help with stairs or ramp for entrance   Can travel by private vehicle        Equipment Recommendations  Rolling walker (2 wheels)    Recommendations for Other Services       Precautions / Restrictions Precautions Precautions: Fall Precaution Comments: monitor O2 Restrictions Weight Bearing Restrictions: No LLE Weight Bearing: Weight bearing as tolerated     Mobility  Bed Mobility Overal bed mobility: Needs Assistance Bed Mobility: Sit to Supine     Supine to sit: Contact guard, HOB elevated, Used rails Sit to supine: Min assist   General bed mobility comments: Assist for L LE onto bed. Increased time.    Transfers Overall transfer level: Needs assistance Equipment used: Rolling walker (2 wheels) Transfers: Sit to/from Stand Sit to Stand: Contact guard assist           General transfer comment: Cues for safety, hand placement. Increased time.    Ambulation/Gait Ambulation/Gait assistance: Contact guard assist Gait Distance (Feet): 100 Feet Assistive device: Rolling walker (2 wheels) Gait Pattern/deviations: Step-through pattern, Decreased stride length       General Gait Details: CGA for safety. Slow, effortful gait . O2 86% on RA, Dyspnea 2/4   Stairs             Wheelchair Mobility     Tilt Bed    Modified  Rankin (Stroke Patients Only)       Balance Overall balance assessment: Needs assistance         Standing balance support: Reliant on assistive device for balance, Bilateral upper extremity supported, During functional activity Standing balance-Leahy Scale: Fair                              Cognition Arousal: Alert Behavior During Therapy: WFL for tasks assessed/performed Overall Cognitive Status: Within Functional Limits for tasks assessed                                          Exercises Total Joint Exercises Ankle Circles/Pumps: AROM, Both, 10 reps Quad Sets: AROM, Left, 10 reps Heel Slides: AAROM, Left, 10 reps    General Comments        Pertinent Vitals/Pain Pain Assessment Pain Assessment: Faces Faces Pain Scale: Hurts little more Pain Location: L LE Pain Descriptors / Indicators: Aching, Discomfort, Sore Pain Intervention(s): Limited activity within patient's tolerance, Monitored during session, Repositioned    Home Living Family/patient expects to be discharged to:: Private residence Living Arrangements: Spouse/significant other Available Help at Discharge: Family Type of Home: House Home Access: Stairs to enter Entrance Stairs-Rails: Right Entrance Stairs-Number of Steps: 2   Home Layout: One level Home Equipment: Firefighter      Prior Function  PT Goals (current goals can now be found in the care plan section) Acute Rehab PT Goals Patient Stated Goal: to be able to go to [facility] PT Goal Formulation: With patient/family Time For Goal Achievement: 04/10/23 Potential to Achieve Goals: Good Progress towards PT goals: Progressing toward goals    Frequency    7X/week      PT Plan      Co-evaluation              AM-PAC PT "6 Clicks" Mobility   Outcome Measure  Help needed turning from your back to your side while in a flat bed without using bedrails?: A Little Help needed moving  from lying on your back to sitting on the side of a flat bed without using bedrails?: A Little Help needed moving to and from a bed to a chair (including a wheelchair)?: A Little Help needed standing up from a chair using your arms (e.g., wheelchair or bedside chair)?: A Little Help needed to walk in hospital room?: A Little Help needed climbing 3-5 steps with a railing? : A Little 6 Click Score: 18    End of Session Equipment Utilized During Treatment: Gait belt Activity Tolerance: Patient tolerated treatment well Patient left: in bed;with call bell/phone within reach;with bed alarm set   PT Visit Diagnosis: Pain;Other abnormalities of gait and mobility (R26.89) Pain - Right/Left: Left Pain - part of body: Leg     Time: 1610-9604 PT Time Calculation (min) (ACUTE ONLY): 15 min  Charges:    $Gait Training: 8-22 mins PT General Charges $$ ACUTE PT VISIT: 1 Visit                         Faye Ramsay, PT Acute Rehabilitation  Office: [phone]

## 2023-03-27 NOTE — Plan of Care (Signed)
  Problem: Education: Goal: Knowledge of General Education information will improve Description: Including pain rating scale, medication(s)/side effects and non-pharmacologic comfort measures Outcome: Progressing   Problem: Clinical Measurements: Goal: Ability to maintain clinical measurements within normal limits will improve Outcome: Progressing   Problem: Elimination: Goal: Will not experience complications related to urinary retention Outcome: Progressing   Problem: Safety: Goal: Ability to remain free from injury will improve Outcome: Progressing   Problem: Pain Management: Goal: Pain level will decrease with appropriate interventions Outcome: Progressing

## 2023-03-27 NOTE — TOC Initial Note (Signed)
Transition of Care [facility]) - Initial/Assessment Note   Patient Details  Name: Rachel Ewing MRN: [id number] Date of Birth: 08/01/1950  [facility]) CM/SW Contact:    Ewing Schlein, LCSW Phone Number: 03/27/2023, 2:14 PM  Clinical Narrative: PT evaluation recommended HH and a rolling walker as patient cannot receive a wheelchair and a rolling walker at discharge. CSW attempted to meet with patient, but patient was too lethargic to rouse. CSW followed up with patient's daughter, Asa Saunas, regarding recommendations. Daughter agreeable to a rolling walker rather than a wheelchair. Daughter reported no HH agency preference and [employer] will have to provide the rolling walker due to patient's [facility]. CSW made DME referral to Zack with [employer]. [employer] to deliver rolling walker to patient's room. CSW made HHPT/OT referral to Cindie with [facility], which was accepted. Hospitalist placed HH orders. CSW updated daughter.  Expected Discharge Plan: Home w Home Health Services Barriers to Discharge: Continued Medical Work up  Patient Goals and CMS Choice Patient states their goals for this hospitalization and ongoing recovery are:: Get [facility] and DME set up CMS Medicare.gov Compare Post Acute Care list provided to:: Patient Represenative (must comment) Choice offered to / list presented to : Adult Children  Expected Discharge Plan and Services In-house Referral: Clinical Social Work Post Acute Care Choice: Home Health, Durable Medical Equipment Living arrangements for the past 2 months: Single Family Home           DME Arranged: Walker rolling DME Agency: [facility] Date DME Agency Contacted: 03/27/23 Time DME Agency Contacted: [phone] Representative spoke with at DME Agency: Lonia Blood Agency: [facility] Date [facility] Agency Contacted: 03/27/23 Time HH Agency Contacted: [phone] Representative spoke with at [facility] Agency: Cindie  Prior Living Arrangements/Services Living arrangements for the past  2 months: Single Family Home Lives with:: Significant Other Patient language and need for interpreter reviewed:: Yes Do you feel safe going back to the place where you live?: Yes      Need for Family Participation in Patient Care: Yes (Comment) Care giver support system in place?: Yes (comment) Criminal Activity/Legal Involvement Pertinent to Current Situation/Hospitalization: No - Comment as needed  Activities of Daily Living ADL Screening (condition at time of admission) Independently performs ADLs?: Yes (appropriate for developmental age) Is the patient deaf or have difficulty hearing?: Yes Does the patient have difficulty seeing, even when wearing glasses/contacts?: No Does the patient have difficulty concentrating, remembering, or making decisions?: No  Permission Sought/Granted Permission sought to share information with : Other (comment) Permission granted to share information with : Yes, Verbal Permission Granted Permission granted to share info w AGENCY: [employer], HH agencies  Emotional Assessment Appearance:: Appears stated age Attitude/Demeanor/Rapport: Lethargic Orientation: : Oriented to Self, Oriented to Place, Oriented to  Time, Oriented to Situation Psych Involvement: No (comment)  Admission diagnosis:  Closed fracture of left hip (HCC) [S72.002A] Fall, initial encounter [W19.XXXA] Closed fracture of neck of left femur, initial encounter (HCC) [S72.002A] Patient Active Problem List   Diagnosis Date Noted   Closed fracture of left hip (HCC) 03/25/2023   Hyperlipidemia 03/25/2023   Restless leg syndrome 03/25/2023   PAD (peripheral artery disease) (HCC) 03/25/2023   Peripheral neuropathy 03/25/2023   COPD, moderate (HCC) 06/05/2015   Cigarette nicotine dependence with nicotine-induced disorder 09/10/2013   Stress-induced cardiomyopathy 09/10/2013   Gastroesophageal reflux disease without esophagitis 07/23/2012   CAD (coronary artery disease) 10/24/2011   Essential  hypertension 02/21/2011   PCP:  Pcp, No Pharmacy:  [id number] [place], [place] - [phone] AT [facility] OF [facility] & [address] [place] [address] Phone: [phone] Fax: [phone]  Social Determinants of Health (SDOH) Social History: SDOH Screenings   Food Insecurity: No Food Insecurity (03/25/2023)  Housing: Low Risk  (03/25/2023)  Transportation Needs: No Transportation Needs (03/25/2023)  Utilities: Not At Risk (03/25/2023)  Financial Resource Strain: Low Risk  (06/05/2022)   Received from [facility]  Recent Concern: Financial Resource Strain - High Risk (05/27/2022)   Received from [facility]  Physical Activity: Unknown (11/25/2021)   Received from [facility]  Recent Concern: Physical Activity - Inactive (11/25/2021)   Received from [facility]  Social Connections: Unknown (11/26/2022)   Received from [facility]  Stress: No Stress Concern Present (06/05/2022)   Received from [facility]  Tobacco Use: High Risk (03/25/2023)   SDOH Interventions:    Readmission Risk Interventions     No data to display

## 2023-03-28 DIAGNOSIS — S72002A Fracture of unspecified part of neck of left femur, initial encounter for closed fracture: Secondary | ICD-10-CM | POA: Diagnosis not present

## 2023-03-28 LAB — CBC
HCT: 25.6 % — ABNORMAL LOW (ref 36.0–46.0)
Hemoglobin: 7.7 g/dL — ABNORMAL LOW (ref 12.0–15.0)
MCH: 28.8 pg (ref 26.0–34.0)
MCHC: 30.1 g/dL (ref 30.0–36.0)
MCV: 95.9 fL (ref 80.0–100.0)
Platelets: 175 10*3/uL (ref 150–400)
RBC: 2.67 MIL/uL — ABNORMAL LOW (ref 3.87–5.11)
RDW: 13.2 % (ref 11.5–15.5)
WBC: 5.9 10*3/uL (ref 4.0–10.5)
nRBC: 0 % (ref 0.0–0.2)

## 2023-03-28 MED ORDER — SENNA 8.6 MG PO TABS: 1.0000 | ORAL_TABLET | Freq: Two times a day (BID) | ORAL | 0 refills | Status: AC

## 2023-03-28 NOTE — Progress Notes (Addendum)
SATURATION QUALIFICATIONS: (This note is used to comply with regulatory documentation for home oxygen)  Patient Saturations on Room Air at Rest = 88%  Patient Saturations on Room Air while Ambulating = 83%  Patient Saturations on 1-2 Liters of oxygen while Ambulating = 91-92%   Rachel Ewing, PT Acute Rehabilitation  Office: [phone]

## 2023-03-28 NOTE — Discharge Summary (Signed)
Physician Discharge Summary  Rachel Ewing YSA:[id number] DOB: 07-11-1950 DOA: 03/25/2023  PCP: Oneita Hurt, No  Admit date: 03/25/2023 Discharge date: 03/28/2023  Admitted From: Home Disposition: Home with home health  Recommendations for Outpatient Follow-up:  Follow up with PCP in 1-2 weeks Please obtain BMP/CBC in one week Orthopedics to schedule follow-up  Home Health: PT/OT Equipment/Devices: Rolling walker, oxygen  Discharge Condition: Stable CODE STATUS: Full code Diet recommendation: Low-salt diet  Discharge summary: Patient is a 72 year old female with past medical history significant for hyperlipidemia, hypertension, CAD, PAD, history of stress-induced cardiomyopathy, COPD, active tobacco use and GERD.  Patient was admitted with left hip fracture following a fall.  Patient underwent total left hip arthroplasty 11/3.  She does have history of COPD, she was noted to be hypoxemic on mobility which is likely her chronic issues.  She has adequately improved.  Will be discharged home with home health PT OT.  She will also be prescribed oxygen and she will do outpatient follow-up.    Closed fracture of left hip (HCC) -Status post left total hip arthroplasty, Dr. Linna Caprice 11/3 -Xarelto 2.5 Mg p.o.daily and Plavix for DVT prophylaxis.  Patient is already on Xarelto and Plavix due to severe peripheral vascular disease. -Weightbearing as tolerated. -PT OT.  She will benefit with home health PT OT.  Will order. -Adequate pain control along with bowel regimen. -Orthopedics to schedule follow-up.   Anemia due to acute blood loss: Anticipated from long bone fracture and surgery.  Hemoglobin 12-8.2-7.7.  Recheck in 1 week to ensure stabilization.   CAD (coronary artery disease) -Stable.  Resume Plavix.   COPD, moderate (HCC): Stable. Pulmicort 0.5 mg twice daily. DuoNebs 4 times a day. Continue Trelegy Ellipta twice daily once discharged. Patient was noted to be hypoxemic with mobility,  will be prescribed oxygen.  Smoking cessation counseling done.      Essential hypertension -Resume lisinopril. -Blood pressure is fairly stable.   Gastroesophageal reflux disease without esophagitis, on PPI     Hyperlipidemia Continue rosuvastatin 20 mg p.o. daily.     PAD (peripheral artery disease) (HCC) Resuming Xarelto.     Peripheral neuropathy Currently off gabapentin and Lyrica.  Patient is medically stable.  Has adequate support system at home.  Stable to transition home with home health PT OT.  Discharge Diagnoses:  Principal Problem:   Closed fracture of left hip (HCC) Active Problems:   CAD (coronary artery disease)   COPD, moderate (HCC)   Essential hypertension   Gastroesophageal reflux disease without esophagitis   Hyperlipidemia   Restless leg syndrome   PAD (peripheral artery disease) (HCC)   Peripheral neuropathy    Discharge Instructions  Discharge Instructions     Diet - low sodium heart healthy   Complete by: As directed    Increase activity slowly   Complete by: As directed       Allergies as of 03/28/2023       Reactions   Azithromycin Itching   Codeine Nausea Only, Other (See Comments)   Causes stomach pain   Penicillins Other (See Comments), Rash   Tolerates cephalosporins Cause stomach pain   Aspirin    Can Take 81 mg but not 325 mg. GI Upset (intolerance) Nausea Only   Tramadol Nausea Only   Cyclobenzaprine Nausea Only   Prednisone Nausea Only, Other (See Comments)   Low dose is ok Can only take 10mg  of prednisone- gives her shakes         Medication List  TAKE these medications    albuterol (2.5 MG/3ML) 0.083% nebulizer solution Commonly known as: PROVENTIL Take 2.5 mg by nebulization every 6 (six) hours as needed for wheezing or shortness of breath.   clopidogrel 75 MG tablet Commonly known as: PLAVIX Take 75 mg by mouth every morning.   FLUoxetine 20 MG capsule Commonly known as: PROZAC Take 20 mg by mouth  daily.   HYDROcodone-acetaminophen 5-325 MG tablet Commonly known as: NORCO/VICODIN Take 1 tablet by mouth every 4 (four) hours as needed for up to 7 days for moderate pain (pain score 4-6) or severe pain (pain score 7-10). What changed:  how much to take when to take this reasons to take this   Linzess 72 MCG capsule Generic drug: linaclotide Take 72 mcg by mouth every morning.   lisinopril 10 MG tablet Commonly known as: ZESTRIL Take 10 mg by mouth daily.   pregabalin 25 MG capsule Commonly known as: LYRICA Take 25 mg by mouth 3 (three) times daily.   rOPINIRole 1 MG tablet Commonly known as: REQUIP Take 1 mg by mouth at bedtime.   rosuvastatin 20 MG tablet Commonly known as: CRESTOR Take 20 mg by mouth at bedtime.   senna 8.6 MG Tabs tablet Commonly known as: SENOKOT Take 1 tablet (8.6 mg total) by mouth 2 (two) times daily.   sertraline 50 MG tablet Commonly known as: ZOLOFT Take 50 mg by mouth every morning.   tiZANidine 4 MG tablet Commonly known as: ZANAFLEX Take 4 mg by mouth at bedtime.   Trelegy Ellipta 100-62.5-25 MCG/ACT Aepb Generic drug: Fluticasone-Umeclidin-Vilant Inhale 1 puff into the lungs daily as needed (sob/wheezing).   Xarelto 2.5 MG Tabs tablet Generic drug: rivaroxaban Take 2.5 mg by mouth in the morning.               Durable Medical Equipment  (From admission, onward)           Start     Ordered   03/28/23 1121  For home use only DME oxygen  Once       Comments: Patient Saturations on Room Air at Rest = 88%   Patient Saturations on [employer] while Ambulating = 83%   Patient Saturations on 1-2 Liters of oxygen while Ambulating = 91-92%  Question Answer Comment  Length of Need Lifetime   Mode or (Route) Nasal cannula   Liters per Minute 2   Frequency Continuous (stationary and portable oxygen unit needed)   Oxygen conserving device Yes   Oxygen delivery system Gas      03/28/23 1121   03/27/23 1409  For home use  only DME Walker rolling  Once       Question Answer Comment  Walker: With 5 Inch Wheels   Patient needs a walker to treat with the following condition Hip fracture (HCC)      03/27/23 1408            Follow-up Information     Clois Dupes, PA-C. Schedule an appointment as soon as possible for a visit in 2 week(s).   Specialty: Orthopedic Surgery Why: For wound re-check Contact information: [address], [address] [place] [address] [phone]         [facility] Follow up.   Specialty: Home Health Services Why: [facility] will provide PT and OT in the home. Contact information: [address] [place] [address] [phone]         [facility] Follow up.  Why: You will discharge home on home oxygen through [facility].               Allergies  Allergen Reactions   Azithromycin Itching   Codeine Nausea Only and Other (See Comments)    Causes stomach pain     Penicillins Other (See Comments) and Rash    Tolerates cephalosporins Cause stomach pain    Aspirin     Can Take 81 mg but not 325 mg. GI Upset (intolerance) Nausea Only   Tramadol Nausea Only   Cyclobenzaprine Nausea Only   Prednisone Nausea Only and Other (See Comments)    Low dose is ok Can only take 10mg  of prednisone- gives her shakes      Consultations: Orthopedics   Procedures/Studies: ECHOCARDIOGRAM COMPLETE  Result Date: 03/26/2023    ECHOCARDIOGRAM REPORT   Patient Name:   ELLYSE ROTOLO Date of Exam: 03/26/2023 Medical Rec #:  [id number]    Height:       62.0 in Accession #:    [id number]   Weight:       133.8 lb Date of Birth:  [birth year]    BSA:          1.612 m Patient Age:    72 years     BP:           110/87 mmHg Patient Gender: F            HR:           64 bpm. Exam Location:  Inpatient Procedure: 2D Echo, Color Doppler and Cardiac Doppler Indications:    CAD Native Vessel I25.10, Pre op  History:        Patient has no prior history of  Echocardiogram examinations.                 CAD; COPD.  Sonographer:    Harriette Bouillon RDCS Referring Phys: [phone] DAVID MANUEL ORTIZ IMPRESSIONS  1. Left ventricular ejection fraction, by estimation, is 65 to 70%. The left ventricle has normal function. The left ventricle has no regional wall motion abnormalities. Left ventricular diastolic parameters are consistent with Grade I diastolic dysfunction (impaired relaxation).  2. Right ventricular systolic function is normal. The right ventricular size is normal. Tricuspid regurgitation signal is inadequate for assessing PA pressure.  3. The mitral valve is grossly normal. Trivial mitral valve regurgitation.  4. The aortic valve is tricuspid. Aortic valve regurgitation is not visualized.  5. The inferior vena cava is normal in size with <50% respiratory variability, suggesting right atrial pressure of 8 mmHg. Comparison(s): No prior Echocardiogram. FINDINGS  Left Ventricle: Left ventricular ejection fraction, by estimation, is 65 to 70%. The left ventricle has normal function. The left ventricle has no regional wall motion abnormalities. The left ventricular internal cavity size was normal in size. There is  no left ventricular hypertrophy. Left ventricular diastolic parameters are consistent with Grade I diastolic dysfunction (impaired relaxation). Right Ventricle: The right ventricular size is normal. No increase in right ventricular wall thickness. Right ventricular systolic function is normal. Tricuspid regurgitation signal is inadequate for assessing PA pressure. Left Atrium: Left atrial size was normal in size. Right Atrium: Right atrial size was normal in size. Pericardium: There is no evidence of pericardial effusion. Presence of epicardial fat layer. Mitral Valve: The mitral valve is grossly normal. Trivial mitral valve regurgitation. Tricuspid Valve: The tricuspid valve is grossly normal. Tricuspid valve regurgitation is mild. Aortic Valve: The aortic valve  is tricuspid. There is  mild aortic valve annular calcification. Aortic valve regurgitation is not visualized. Pulmonic Valve: The pulmonic valve was not well visualized. Pulmonic valve regurgitation is trivial. Aorta: The aortic root and ascending aorta are structurally normal, with no evidence of dilitation. Venous: The inferior vena cava is normal in size with less than 50% respiratory variability, suggesting right atrial pressure of 8 mmHg. IAS/Shunts: The interatrial septum was not well visualized.  LEFT VENTRICLE PLAX 2D LVIDd:         4.40 cm   Diastology LVIDs:         3.00 cm   LV e' medial:    6.42 cm/s LV PW:         0.90 cm   LV E/e' medial:  13.2 LV IVS:        0.90 cm   LV e' lateral:   6.20 cm/s LVOT diam:     1.80 cm   LV E/e' lateral: 13.6 LV SV:         63 LV SV Index:   39 LVOT Area:     2.54 cm  RIGHT VENTRICLE            IVC RV S prime:     8.81 cm/s  IVC diam: 1.60 cm TAPSE (M-mode): 2.1 cm LEFT ATRIUM             Index        RIGHT ATRIUM           Index LA diam:        3.10 cm 1.92 cm/m   RA Area:     10.30 cm LA Vol (A2C):   54.2 ml 33.63 ml/m  RA Volume:   18.60 ml  11.54 ml/m LA Vol (A4C):   50.6 ml 31.40 ml/m LA Biplane Vol: 55.9 ml 34.69 ml/m  AORTIC VALVE LVOT Vmax:   128.00 cm/s LVOT Vmean:  78.700 cm/s LVOT VTI:    0.249 m  AORTA Ao Root diam: 3.00 cm Ao Asc diam:  2.50 cm MITRAL VALVE MV Area (PHT): 3.36 cm    SHUNTS MV Decel Time: 226 msec    Systemic VTI:  0.25 m MV E velocity: 84.60 cm/s  Systemic Diam: 1.80 cm MV A velocity: 96.10 cm/s MV E/A ratio:  0.88 Nona Dell MD Electronically signed by Nona Dell MD Signature Date/Time: 03/26/2023/4:22:58 PM    Final    DG HIP UNILAT WITH PELVIS 1V LEFT  Result Date: 03/26/2023 CLINICAL DATA:  [id number] Surgery, elective [id number] EXAM: DG HIP (WITH OR WITHOUT PELVIS) 1V*L* COMPARISON:  03/25/2023 FINDINGS: Two C-arm fluoroscopic images were obtained intraoperatively and submitted for post operative interpretation. Images  demonstrate left total hip arthroplasty hardware in place. 9 seconds of fluoroscopy time utilized. Radiation dose: 1.1 mGy. Please see the performing provider's procedural report for further detail. IMPRESSION: Intraoperative fluoroscopy for left total hip arthroplasty. Electronically Signed   By: Duanne Guess D.O.   On: 03/26/2023 12:11   DG Pelvis Portable  Result Date: 03/26/2023 CLINICAL DATA:  [id number] Closed displaced fracture of left femoral neck (HCC) [id number] EXAM: PORTABLE PELVIS 1-2 VIEWS COMPARISON:  03/25/2023 FINDINGS: Interval postsurgical changes from left total hip arthroplasty. Arthroplasty components are in their expected alignment. No periprosthetic fracture or evidence of other complication. Expected postoperative changes within the overlying soft tissues. IMPRESSION: Interval postsurgical changes from left total hip arthroplasty without evidence of complication. Electronically Signed   By: Duanne Guess D.O.   On: 03/26/2023 12:10   DG C-Arm  1-60 Min-No Report  Result Date: 03/26/2023 Fluoroscopy was utilized by the requesting physician.  No radiographic interpretation.   DG C-Arm 1-60 Min-No Report  Result Date: 03/26/2023 Fluoroscopy was utilized by the requesting physician.  No radiographic interpretation.   DG Knee Left Port  Result Date: 03/25/2023 CLINICAL DATA:  Femoral neck fracture. EXAM: PORTABLE LEFT KNEE - 2 VIEW COMPARISON:  None Available. FINDINGS: No fracture or dislocation. Preserved joint spaces and bone mineralization. Hyperostosis along the patella. No joint effusion on lateral view. IMPRESSION: No acute osseous abnormality. Electronically Signed   By: Karen Kays M.D.   On: 03/25/2023 14:33   DG Chest Portable 1 View  Result Date: 03/25/2023 CLINICAL DATA:  Fall.  Cough.  COPD. EXAM: PORTABLE CHEST 1 VIEW COMPARISON:  Radiographs 02/13/2023 and 09/15/2021. FINDINGS: 1332 hours. Lordotic positioning. The heart size and mediastinal contours are  normal. There is aortic atherosclerosis. The lungs are clear. No pleural effusion or pneumothorax. No acute osseous findings are evident. Telemetry leads overlie the chest. IMPRESSION: No evidence of acute chest injury or active cardiopulmonary process. Electronically Signed   By: Carey Bullocks M.D.   On: 03/25/2023 13:59   DG Hip Unilat W or Wo Pelvis 2-3 Views Left  Result Date: 03/25/2023 CLINICAL DATA:  Pain after fall EXAM: DG HIP (WITH OR WITHOUT PELVIS) 3V LEFT COMPARISON:  None Available. FINDINGS: Mildly displaced subcapital left femoral neck fracture. No additional fracture or dislocation. Preserved joint spaces. Osteopenia. Presumed vascular calcifications in the pelvis. Iliac stents. IMPRESSION: Left subcapital femoral neck fracture. Electronically Signed   By: Karen Kays M.D.   On: 03/25/2023 12:19   (Echo, Carotid, EGD, Colonoscopy, ERCP)    Subjective: Patient seen in the morning rounds.  She has mild pain but controlled with oral pain medications.  Denies any other complaints.  Patient tells me that she does have chronic wheezing and shortness of breath but never used oxygen.  It was noted that on mobility her oxygen will drop less than 88%.  Patient agreed to go home with oxygen.  She is also agreeable to quit smoking.   Discharge Exam: Vitals:   03/28/23 0854 03/28/23 1245  BP: (!) 112/59 114/72  Pulse: 70 71  Resp: 17 14  Temp:  98 F (36.7 C)  SpO2: 98% 97%   Vitals:   03/28/23 0526 03/28/23 0703 03/28/23 0854 03/28/23 1245  BP: 111/72  (!) 112/59 114/72  Pulse: 85  70 71  Resp: 16  17 14   Temp: 98.6 F (37 C)   98 F (36.7 C)  TempSrc: Oral     SpO2: (!) 74% 94% 98% 97%  Weight:      Height:        General: Pt is alert, awake, not in acute distress at rest.  Sitting in chair on room air. Cardiovascular: RRR, S1/S2 +, no rubs, no gallops Respiratory: CTA bilaterally, occasional expiratory wheezes. Abdominal: Soft, NT, ND, bowel sounds + Extremities: Left  anterior thigh incision clean and intact.  There is minimal swelling.  Distal neurovascular status intact.    The results of significant diagnostics from this hospitalization (including imaging, microbiology, ancillary and laboratory) are listed below for reference.     Microbiology: No results found for this or any previous visit (from the past 240 hour(s)).   Labs: BNP (last 3 results) No results for input(s): "BNP" in the last 8760 hours. Basic Metabolic Panel: Recent Labs  Lab 03/25/23 1401 03/26/23 0331 03/27/23 0320  NA 138  135 135  K 4.1 4.2 3.9  CL 108 102 106  CO2 24 23 22   GLUCOSE 120* 119* 136*  BUN 17 20 22   CREATININE 0.93 1.01* 1.28*  CALCIUM 8.9 9.1 7.7*  MG 1.9  --   --   PHOS 2.7  --   --    Liver Function Tests: Recent Labs  Lab 03/26/23 0331  AST 56*  ALT 62*  ALKPHOS 72  BILITOT 1.1  PROT 7.2  ALBUMIN 3.5   No results for input(s): "LIPASE", "AMYLASE" in the last 168 hours. No results for input(s): "AMMONIA" in the last 168 hours. CBC: Recent Labs  Lab 03/25/23 1401 03/26/23 0331 03/27/23 0539 03/28/23 0321  WBC 11.6* 8.8 7.2 5.9  HGB 12.3 12.6 8.2* 7.7*  HCT 38.4 40.0 26.4* 25.6*  MCV 90.6 93.0 95.0 95.9  PLT 245 254 161 175   Cardiac Enzymes: No results for input(s): "CKTOTAL", "CKMB", "CKMBINDEX", "TROPONINI" in the last 168 hours. BNP: Invalid input(s): "POCBNP" CBG: No results for input(s): "GLUCAP" in the last 168 hours. D-Dimer No results for input(s): "DDIMER" in the last 72 hours. Hgb A1c No results for input(s): "HGBA1C" in the last 72 hours. Lipid Profile No results for input(s): "CHOL", "HDL", "LDLCALC", "TRIG", "CHOLHDL", "LDLDIRECT" in the last 72 hours. Thyroid function studies No results for input(s): "TSH", "T4TOTAL", "T3FREE", "THYROIDAB" in the last 72 hours.  Invalid input(s): "FREET3" Anemia work up No results for input(s): "VITAMINB12", "FOLATE", "FERRITIN", "TIBC", "IRON", "RETICCTPCT" in the last 72  hours. Urinalysis No results found for: "COLORURINE", "APPEARANCEUR", "LABSPEC", "PHURINE", "GLUCOSEU", "HGBUR", "BILIRUBINUR", "KETONESUR", "PROTEINUR", "UROBILINOGEN", "NITRITE", "LEUKOCYTESUR" Sepsis Labs Recent Labs  Lab 03/25/23 1401 03/26/23 0331 03/27/23 0539 03/28/23 0321  WBC 11.6* 8.8 7.2 5.9   Microbiology No results found for this or any previous visit (from the past 240 hour(s)).   Time coordinating discharge: 35 minutes  SIGNED:   Dorcas Carrow, MD  [facility] 03/28/2023, 1:02 PM

## 2023-03-28 NOTE — Progress Notes (Signed)
    Subjective:  Patient reports pain as mild.  Denies N/V/CP/SOB/Abd pain. Reports mild anterior thigh pain. She reports numbness in LLE per baseline. She is eager for d/c home.   Objective:   VITALS:   Vitals:   03/27/23 1440 03/27/23 2152 03/28/23 0526 03/28/23 0703  BP:  120/64 111/72   Pulse:  80 85   Resp:  15 16   Temp:  98.4 F (36.9 C) 98.6 F (37 C)   TempSrc:  Oral Oral   SpO2: (!) 86% 98% (!) 74% 94%  Weight:      Height:        Patient sitting up in bed. NAD.  Neurologically intact ABD soft Neurovascular intact Intact pulses distally Dorsiflexion/Plantar flexion intact Incision: dressing C/D/I No cellulitis present Compartment soft Reports numbness in LLE per baseline. Motor function intact.   Lab Results  Component Value Date   WBC 5.9 03/28/2023   HGB 7.7 (L) 03/28/2023   HCT 25.6 (L) 03/28/2023   MCV 95.9 03/28/2023   PLT 175 03/28/2023   BMET    Component Value Date/Time   NA 135 03/27/2023 0320   K 3.9 03/27/2023 0320   CL 106 03/27/2023 0320   CO2 22 03/27/2023 0320   GLUCOSE 136 (H) 03/27/2023 0320   BUN 22 03/27/2023 0320   CREATININE 1.28 (H) 03/27/2023 0320   CALCIUM 7.7 (L) 03/27/2023 0320   GFRNONAA 45 (L) 03/27/2023 0320     Assessment/Plan: 2 Days Post-Op   Principal Problem:   Closed fracture of left hip (HCC) Active Problems:   CAD (coronary artery disease)   COPD, moderate (HCC)   Essential hypertension   Gastroesophageal reflux disease without esophagitis   Hyperlipidemia   Restless leg syndrome   PAD (peripheral artery disease) (HCC)   Peripheral neuropathy   WBAT with walker DVT ppx: Xarelto 2.5mg  BID and plavix, SCDs, TEDS PO pain control PT/OT: PT to come by today.  Dispo:  - Patient under care of the medical team disposition per their recommendation. Pain medication printed in chart.    Clois Dupes, PA-C 03/28/2023, 8:06 AM   [facility]  [facility] [address], [address], [place],  [place] [address] Phone: [phone] [email or web address] Facebook  [employer]

## 2023-03-28 NOTE — Progress Notes (Signed)
Occupational Therapy Treatment and Discharge Patient Details Name: Rachel Ewing MRN: [id number] DOB: Nov 09, 1950 Today's Date: 03/28/2023   History of present illness 72 yo female admitted with L hip fx. S/P L THA 03/26/23. Hx of falls, neuropathy, COPD, RLS, CAD, PAD   OT comments  This 72 yo female seen today prior to D/C to make sure she was aware of how to LB ADLs the most efficiently given her recent surgery and she has already figured out she is going to go as simple as she can and has a significant other to A prn if she cannot do an ADL at present. Pt will decided once she is home if she wants/needs a 3n1 and if so she will order one online. No further acute OTs, but will benefit from [facility] to make sure she gets to a Mod I level at home. Pt to D/C today so we will D/C from acute OT.      If plan is discharge home, recommend the following:  A little help with walking and/or transfers;A lot of help with bathing/dressing/bathroom;Assistance with cooking/housework;Help with stairs or ramp for entrance;Assist for transportation   Equipment Recommendations  BSC/3in1 (will get one on her own if decides once she gets home she really needs it)       Precautions / Restrictions Precautions Precautions: Fall Precaution Comments: monitor O2 Restrictions Weight Bearing Restrictions: No LLE Weight Bearing: Weight bearing as tolerated              ADL either performed or assessed with clinical judgement   ADL Overall ADL's : Needs assistance/impaired                                       General ADL Comments: Pt plans on wearing longer night gowns, moos-moos, house dresses so she does not have to worry about underwear and pants. She has family with her that can A prn as well. We did discuss that it is easier to work on undressing before dressing. She felt like a 3n1 would be benefical and will get one on her own due to insurance will not pay for one.    Extremity/Trunk  Assessment Upper Extremity Assessment Upper Extremity Assessment: Overall WFL for tasks assessed            Vision Baseline Vision/History: 1 Wears glasses Patient Visual Report: No change from baseline            Cognition Arousal: Alert Behavior During Therapy: WFL for tasks assessed/performed Overall Cognitive Status: Within Functional Limits for tasks assessed                                                     Pertinent Vitals/ Pain       Pain Assessment Pain Assessment: Faces Faces Pain Scale: Hurts a little bit Pain Location: L LE Pain Descriptors / Indicators: Discomfort, Sore         Frequency  Min 1X/week        Progress Toward Goals    Progress towards OT goals:  (all acute OT education completed)      AM-PAC OT "6 Clicks" Daily Activity     Outcome Measure   Help from another person eating meals?: None Help from  another person taking care of personal grooming?: A Little Help from another person toileting, which includes using toliet, bedpan, or urinal?: A Little Help from another person bathing (including washing, rinsing, drying)?: A Lot Help from another person to put on and taking off regular upper body clothing?: A Little Help from another person to put on and taking off regular lower body clothing?: A Lot 6 Click Score: 17       Activity Tolerance Patient tolerated treatment well   Patient Left in bed;with call bell/phone within reach;with family/visitor present;with nursing/sitter in room           Time: 1311-1331 OT Time Calculation (min): 20 min  Charges: OT General Charges $OT Visit: 1 Visit OT Treatments $Self Care/Home Management : 8-22 mins  Lindon Romp OT Acute Rehabilitation Services Office [phone]    Evette Georges 03/28/2023, 4:02 PM

## 2023-03-28 NOTE — Progress Notes (Signed)
Physical Therapy Treatment Patient Details Name: Rachel Ewing MRN: [id number] DOB: 1950-12-22 Today's Date: 03/28/2023   History of Present Illness 72 yo female admitted with L hip fx. S/P L THA 03/26/23. Hx of falls, neuropathy, COPD, RLS, CAD, PAD    PT Comments  Pt agreeable to therapy. Still requiring O2 with activity to keep sats >90%. Reviewed/practiced gait and stair training. All PT education completed today but d/c depends on when pt is medically ready.    If plan is discharge home, recommend the following: A little help with walking and/or transfers;A little help with bathing/dressing/bathroom;Assistance with cooking/housework;Assist for transportation;Help with stairs or ramp for entrance   Can travel by private vehicle        Equipment Recommendations       Recommendations for Other Services       Precautions / Restrictions Precautions Precautions: Fall Precaution Comments: monitor O2 Restrictions Weight Bearing Restrictions: No LLE Weight Bearing: Weight bearing as tolerated     Mobility  Bed Mobility               General bed mobility comments: oob in recliner    Transfers Overall transfer level: Needs assistance Equipment used: Rolling walker (2 wheels) Transfers: Sit to/from Stand Sit to Stand: Supervision           General transfer comment: Supv for safety. Increased time.    Ambulation/Gait Ambulation/Gait assistance: Supervision Gait Distance (Feet): 75 Feet Assistive device: Rolling walker (2 wheels) Gait Pattern/deviations: Step-through pattern, Decreased stride length       General Gait Details: Supv for safety. Slow gait speed. O2 83% on RA, 91-92% on O2.   Stairs Stairs: Yes Stairs assistance: Contact guard assist Stair Management: Forwards, Step to pattern, Two rails Number of Stairs: 2 General stair comments: up and over portable stairs x 1. cues for safety, technique, sequence.   Wheelchair Mobility     Tilt Bed     Modified Rankin (Stroke Patients Only)       Balance Overall balance assessment: Needs assistance         Standing balance support: During functional activity Standing balance-Leahy Scale: Fair                              Cognition Arousal: Alert Behavior During Therapy: WFL for tasks assessed/performed Overall Cognitive Status: Within Functional Limits for tasks assessed                                          Exercises      General Comments        Pertinent Vitals/Pain Pain Assessment Pain Assessment: Faces Faces Pain Scale: Hurts a little bit Pain Location: L LE Pain Descriptors / Indicators: Discomfort, Sore Pain Intervention(s): Monitored during session, Repositioned    Home Living                          Prior Function            PT Goals (current goals can now be found in the care plan section) Progress towards PT goals: Progressing toward goals    Frequency    7X/week      PT Plan      Co-evaluation              AM-PAC  PT "6 Clicks" Mobility   Outcome Measure  Help needed turning from your back to your side while in a flat bed without using bedrails?: A Little Help needed moving from lying on your back to sitting on the side of a flat bed without using bedrails?: A Little Help needed moving to and from a bed to a chair (including a wheelchair)?: A Little Help needed standing up from a chair using your arms (e.g., wheelchair or bedside chair)?: A Little Help needed to walk in hospital room?: A Little Help needed climbing 3-5 steps with a railing? : A Little 6 Click Score: 18    End of Session Equipment Utilized During Treatment: Gait belt Activity Tolerance: Patient tolerated treatment well Patient left: in bed;with call bell/phone within reach;with bed alarm set   PT Visit Diagnosis: Pain;Other abnormalities of gait and mobility (R26.89) Pain - Right/Left: Left Pain - part of body:  Leg     Time: 1610-9604 PT Time Calculation (min) (ACUTE ONLY): 35 min  Charges:    $Gait Training: 23-37 mins PT General Charges $$ ACUTE PT VISIT: 1 Visit                         Faye Ramsay, PT Acute Rehabilitation  Office: [phone]

## 2023-03-28 NOTE — TOC Transition Note (Signed)
Transition of Care [facility]) - CM/SW Discharge Note  Patient Details  Name: Rachel Ewing MRN: [id number] Date of Birth: Sep 19, 1950  Transition of [facility] CM/SW Contact:  Ewing Schlein, LCSW Phone Number: 03/28/2023, 11:54 AM  Clinical Narrative: Patient will now need to discharge home with home oxygen. Daughter agreeable to [employer] providing home oxygen so that all DME is through the same company. CSW made home oxygen referral to [facility] with [employer], which was accepted. [employer] to deliver travel oxygen tank to patient's room prior to discharge. CSW notified Cindie with [facility] of discharge. TOC signing off.  Final next level of care: Home w Home Health Services Barriers to Discharge: Barriers Resolved  Patient Goals and CMS Choice CMS Medicare.gov Compare Post Acute Care list provided to:: Patient Represenative (must comment) Choice offered to / list presented to : Adult Children  Discharge Plan and Services Additional resources added to the After Visit Summary for   In-house Referral: Clinical Social Work Post Acute Care Choice: Home Health, Durable Medical Equipment          DME Arranged: Oxygen DME Agency: [employer] Date DME Agency Contacted: 03/28/23 Time DME Agency Contacted: [phone] Representative spoke with at DME Agency: Mitch HH Arranged: PT, OT HH Agency: [facility] Date [facility] Agency Contacted: 03/27/23 Time HH Agency Contacted: [phone] Representative spoke with at [facility] Agency: Cindie  Social Determinants of Health (SDOH) Interventions SDOH Screenings   Food Insecurity: No Food Insecurity (03/25/2023)  Housing: Low Risk  (03/25/2023)  Transportation Needs: No Transportation Needs (03/25/2023)  Utilities: Not At Risk (03/25/2023)  Financial Resource Strain: Low Risk  (06/05/2022)   Received from [facility]  Recent Concern: Financial Resource Strain - High Risk (05/27/2022)   Received from [facility]  Physical Activity: Unknown (11/25/2021)   Received from [facility]  Recent Concern: Physical Activity - Inactive (11/25/2021)   Received [facility]  Social Connections: Unknown (11/26/2022)   Received from [facility]  Stress: No Stress Concern Present (06/05/2022)   Received from [facility]  Tobacco Use: High Risk (03/25/2023)   Readmission Risk Interventions     No data to display

## 2023-07-31 ENCOUNTER — Other Ambulatory Visit: Payer: Self-pay

## 2023-07-31 DIAGNOSIS — I739 Peripheral vascular disease, unspecified: Secondary | ICD-10-CM

## 2023-08-02 ENCOUNTER — Other Ambulatory Visit: Payer: Self-pay | Admitting: Nurse Practitioner

## 2023-08-02 DIAGNOSIS — E2839 Other primary ovarian failure: Secondary | ICD-10-CM

## 2023-08-03 ENCOUNTER — Other Ambulatory Visit: Payer: Self-pay | Admitting: Nurse Practitioner

## 2023-08-03 DIAGNOSIS — Z1231 Encounter for screening mammogram for malignant neoplasm of breast: Secondary | ICD-10-CM

## 2023-08-03 DIAGNOSIS — E2839 Other primary ovarian failure: Secondary | ICD-10-CM

## 2023-08-09 ENCOUNTER — Other Ambulatory Visit: Payer: Self-pay | Admitting: Family Medicine

## 2023-08-09 ENCOUNTER — Ambulatory Visit
Admission: RE | Admit: 2023-08-09 | Discharge: 2023-08-09 | Disposition: A | Discharge status: Home / Self Care | Source: Ambulatory Visit | Attending: Family Medicine | Admitting: Family Medicine

## 2023-08-09 DIAGNOSIS — R058 Other specified cough: Secondary | ICD-10-CM

## 2023-08-11 ENCOUNTER — Ambulatory Visit
Admission: RE | Admit: 2023-08-11 | Discharge: 2023-08-11 | Disposition: A | Discharge status: Home / Self Care | Source: Ambulatory Visit | Attending: Nurse Practitioner | Admitting: Nurse Practitioner

## 2023-08-11 DIAGNOSIS — Z1231 Encounter for screening mammogram for malignant neoplasm of breast: Secondary | ICD-10-CM

## 2023-08-22 ENCOUNTER — Ambulatory Visit
Admission: RE | Admit: 2023-08-22 | Discharge: 2023-08-22 | Disposition: A | Discharge status: Home / Self Care | Source: Ambulatory Visit | Attending: Vascular Surgery | Admitting: Vascular Surgery

## 2023-08-22 DIAGNOSIS — I739 Peripheral vascular disease, unspecified: Secondary | ICD-10-CM

## 2023-08-22 MED ORDER — IOPAMIDOL (ISOVUE-370) INJECTION 76%
500.0000 mL | Freq: Once | INTRAVENOUS | Status: AC | PRN
  Administered 2023-08-22: 120 mL via INTRAVENOUS

## 2023-08-29 NOTE — Progress Notes (Unsigned)
 Patient ID: Rachel Ewing, female   DOB: May 01, 1951, 73 y.o.   MRN: [id number]  Reason for Consult: No chief complaint on file.   Referred by Mariea Clonts, NP  Subjective:     HPI  Rachel Ewing is a 73 y.o. female with history of peripheral arterial disease who presents to establish care.  She has a past medical history of hypertension, COPD, CAD and prediabetes.  In July 2023 she underwent complete endovascular reconstruction of the aorta bifurcation.  In September it sounds like she had a left femoral thrombectomy.  And then in January she underwent what sounds like explant and aortobifemoral bypass.  She spent some time in the ICU during that hospital stay but is now recovered.   6 months ago she was most frustrated with her numbness and tingling pain in her left foot and it affects her walking as she cannot feel her foot in space.   Today she reports *** She denies claudication, rest pain or nonhealing wounds. She is compliant with aspirin, plavix, Xarelto(2.5) and Crestor  Past Medical History:  Diagnosis Date   CAD (coronary artery disease) 10/24/2011   COPD, moderate (HCC) 06/05/2015   Essential hypertension 02/21/2011   Gastroesophageal reflux disease without esophagitis 07/23/2012   High cholesterol    Hyperlipidemia 03/25/2023   Hypertension    PAD (peripheral artery disease) (HCC) 03/25/2023   Peripheral neuropathy 03/25/2023   Restless leg syndrome 03/25/2023   No family history on file. Past Surgical History:  Procedure Laterality Date   AORTOGRAM     TOTAL HIP ARTHROPLASTY Left 03/26/2023   Procedure: TOTAL HIP ARTHROPLASTY ANTERIOR APPROACH;  Surgeon: Samson Frederic, MD;  Location: [facility];  Service: Orthopedics;  Laterality: Left;    Short Social History:  Social History   Tobacco Use   Smoking status: Every Day    Types: Cigarettes   Smokeless tobacco: Never  Substance Use Topics   Alcohol use: Not Currently    Allergies  Allergen Reactions    Azithromycin Itching   Codeine Nausea Only and Other (See Comments)    Causes stomach pain     Penicillins Other (See Comments) and Rash    Tolerates cephalosporins Cause stomach pain    Aspirin     Can Take 81 mg but not 325 mg. GI Upset (intolerance) Nausea Only   Tramadol Nausea Only   Cyclobenzaprine Nausea Only   Prednisone Nausea Only and Other (See Comments)    Low dose is ok Can only take 10mg  of prednisone- gives her shakes      Current Outpatient Medications  Medication Sig Dispense Refill   albuterol (PROVENTIL) (2.5 MG/3ML) 0.083% nebulizer solution Take 2.5 mg by nebulization every 6 (six) hours as needed for wheezing or shortness of breath.     clopidogrel (PLAVIX) 75 MG tablet Take 75 mg by mouth every morning.     FLUoxetine (PROZAC) 20 MG capsule Take 20 mg by mouth daily.     LINZESS 72 MCG capsule Take 72 mcg by mouth every morning.     lisinopril (ZESTRIL) 10 MG tablet Take 10 mg by mouth daily.     pregabalin (LYRICA) 25 MG capsule Take 25 mg by mouth 3 (three) times daily.     rOPINIRole (REQUIP) 1 MG tablet Take 1 mg by mouth at bedtime.     rosuvastatin (CRESTOR) 20 MG tablet Take 20 mg by mouth at bedtime.     senna (SENOKOT) 8.6 MG TABS tablet Take 1 tablet (8.6  mg total) by mouth 2 (two) times daily. 120 tablet 0   sertraline (ZOLOFT) 50 MG tablet Take 50 mg by mouth every morning.     tiZANidine (ZANAFLEX) 4 MG tablet Take 4 mg by mouth at bedtime.     TRELEGY ELLIPTA 100-62.5-25 MCG/ACT AEPB Inhale 1 puff into the lungs daily as needed (sob/wheezing).     XARELTO 2.5 MG TABS tablet Take 2.5 mg by mouth in the morning.     No current facility-administered medications for this visit.    REVIEW OF SYSTEMS  Positive for ***  All other systems were reviewed and are negative     Objective:  Objective   There were no vitals filed for this visit. There is no height or weight on file to calculate BMI.  Physical Exam General: no acute  distress Cardiac: hemodynamically stable Pulm: normal work of breathing Abdomen: well-healed abdominal and groin incisions Neuro: alert, no focal deficit Extremities: no edema, cyanosis or wounds*** Vascular:   Right: ***  Left: ***   Data: ABI ***  CTA independently reviewed Widely patent ABF, slight ectasia of left femoral anastomosis, 1.3 cm.  Echo reviewed EF 65-70%, grade 1 diastolic dysfunction, no RWMA, no valve concerns      Assessment/Plan:     Tanasia Budzinski is a 73 y.o. female with PAD and multiple previous iliac interventions. Now with an aortobifemoral bypass that was performed in Jan 2024. She was seen 6 months ago and noted to have palpable pulses with an ABI of 0.9. She was having numbness at that time. She continues to have numbness and again I explained that this is related to permanent nerve and muscle damage from her episodes of ischemia.  CTA with widely patent ABF with ectasia of the left femoral anastomosis, 1.3 cm ABI stable, 0.9 bilaterally.  Plan for follow up in 12 months with ABI and L groin duplex to continue surveillance of L CFA anastomotic ectasia.   Continue aspirin, 2.5 xaralto and statin     Daria Pastures MD [facility]

## 2023-08-30 ENCOUNTER — Ambulatory Visit (HOSPITAL_COMMUNITY)
Admission: RE | Admit: 2023-08-30 | Discharge: 2023-08-30 | Disposition: A | Discharge status: Home / Self Care | Source: Ambulatory Visit | Attending: Vascular Surgery | Admitting: Vascular Surgery

## 2023-08-30 DIAGNOSIS — I739 Peripheral vascular disease, unspecified: Secondary | ICD-10-CM | POA: Diagnosis not present

## 2023-08-30 LAB — VAS US ABI WITH/WO TBI
Left ABI: 1.1
Right ABI: 1.1

## 2023-09-01 ENCOUNTER — Encounter: Payer: Self-pay | Admitting: Vascular Surgery

## 2023-09-01 ENCOUNTER — Encounter (HOSPITAL_COMMUNITY): Payer: Medicare HMO

## 2023-09-01 ENCOUNTER — Ambulatory Visit: Payer: Medicare HMO | Admitting: Vascular Surgery

## 2023-09-01 VITALS — BP 123/67 | HR 82 | Temp 98.1°F | Resp 20 | Ht 62.0 in | Wt 137.4 lb

## 2023-09-01 DIAGNOSIS — I739 Peripheral vascular disease, unspecified: Secondary | ICD-10-CM | POA: Diagnosis not present

## 2023-09-05 ENCOUNTER — Other Ambulatory Visit: Payer: Self-pay | Admitting: *Deleted

## 2023-09-05 DIAGNOSIS — I739 Peripheral vascular disease, unspecified: Secondary | ICD-10-CM

## 2023-09-25 ENCOUNTER — Other Ambulatory Visit: Payer: Self-pay | Admitting: Nurse Practitioner

## 2023-09-25 DIAGNOSIS — R109 Unspecified abdominal pain: Secondary | ICD-10-CM

## 2023-09-25 DIAGNOSIS — R079 Chest pain, unspecified: Secondary | ICD-10-CM

## 2023-09-25 DIAGNOSIS — R0602 Shortness of breath: Secondary | ICD-10-CM

## 2023-10-10 ENCOUNTER — Ambulatory Visit
Admission: RE | Admit: 2023-10-10 | Discharge: 2023-10-10 | Disposition: A | Discharge status: Home / Self Care | Source: Ambulatory Visit | Attending: Nurse Practitioner | Admitting: Nurse Practitioner

## 2023-10-10 DIAGNOSIS — R109 Unspecified abdominal pain: Secondary | ICD-10-CM

## 2023-10-10 DIAGNOSIS — R079 Chest pain, unspecified: Secondary | ICD-10-CM

## 2023-10-10 DIAGNOSIS — R0602 Shortness of breath: Secondary | ICD-10-CM

## 2023-12-20 ENCOUNTER — Telehealth: Payer: Self-pay | Admitting: Acute Care

## 2023-12-20 NOTE — Telephone Encounter (Addendum)
 Acknowledged, I do not see any documentation from where patient was contacted.   Routing to [employer] nurse to see if she tried contacting this patient as she has upcoming ov 8/22. FYI Kaiya

## 2023-12-20 NOTE — Telephone Encounter (Signed)
 Copied from CRM [id number]. Topic: General - Call Back - No Documentation >> Dec 20, 2023  9:04 AM Rachel Ewing wrote: Reason for CRM: Patient returning call with no documentation, please return call to patient if needed.

## 2023-12-21 NOTE — Telephone Encounter (Signed)
 Patient was probably contacted because of referral,I called patient explained that to her nothing further needed on our end

## 2024-01-12 ENCOUNTER — Ambulatory Visit: Admitting: Acute Care

## 2024-01-17 ENCOUNTER — Ambulatory Visit (INDEPENDENT_AMBULATORY_CARE_PROVIDER_SITE_OTHER)

## 2024-01-17 VITALS — BP 161/89 | HR 70 | Temp 98.5°F | Ht 62.0 in | Wt 141.2 lb

## 2024-01-17 DIAGNOSIS — F17210 Nicotine dependence, cigarettes, uncomplicated: Secondary | ICD-10-CM

## 2024-01-17 DIAGNOSIS — J432 Centrilobular emphysema: Secondary | ICD-10-CM | POA: Diagnosis not present

## 2024-01-17 DIAGNOSIS — F17200 Nicotine dependence, unspecified, uncomplicated: Secondary | ICD-10-CM

## 2024-01-17 DIAGNOSIS — R911 Solitary pulmonary nodule: Secondary | ICD-10-CM

## 2024-01-17 MED ORDER — NICOTINE 21 MG/24HR TD PT24
21.0000 mg | MEDICATED_PATCH | Freq: Every day | TRANSDERMAL | 0 refills | Status: AC
Start: 2024-01-17 — End: ?

## 2024-01-17 MED ORDER — TRELEGY ELLIPTA 100-62.5-25 MCG/ACT IN AEPB: 1.0000 | INHALATION_SPRAY | Freq: Every day | RESPIRATORY_TRACT | 6 refills | Status: AC | PRN

## 2024-01-17 MED ORDER — ALBUTEROL SULFATE HFA 108 (90 BASE) MCG/ACT IN AERS: 2.0000 | INHALATION_SPRAY | Freq: Four times a day (QID) | RESPIRATORY_TRACT | 6 refills | Status: AC | PRN

## 2024-01-17 MED ORDER — NICOTINE POLACRILEX 4 MG MT GUM: 4.0000 mg | CHEWING_GUM | OROMUCOSAL | 0 refills | Status: AC | PRN

## 2024-01-17 MED ORDER — ALBUTEROL SULFATE (2.5 MG/3ML) 0.083% IN NEBU: 2.5000 mg | INHALATION_SOLUTION | Freq: Four times a day (QID) | RESPIRATORY_TRACT | 4 refills | Status: AC | PRN

## 2024-01-17 NOTE — Patient Instructions (Addendum)
 Please stop smoking if you can. Use the nicotine  patches and the nicotine  gum if they are covered with the insurance   We will get a repeat CT scan in  1 month to evaluate your nodule  Use trelegy everyday and rinse your mouth after using it. Use it only once a day    Use your albuterol  inhaler as needed   If you change your mind, get the flu shot next month and ask about the Pneumonia shot which is once in your lifetime    We plan to get some breathing tests for you called PFTs   We will see you back in 3 months

## 2024-01-17 NOTE — Progress Notes (Signed)
 Subjective:   PATIENT ID: Rachel Ewing GENDER: female DOB: February 11, 1951, MRN: [id number]   HPI 73 year old female with a past medical history of CAD, active tobacco use (at least 55-pack-year), COPD on Trelegy (no PFTs on file), PAD (status post bypass), who is presenting to the pulmonary clinic for further evaluation of shortness of breath and multiple lung nodules found on CT chest.  Patient states that she feels short of breath on minimal exertion.  She also has a persistent cough with clear mucus production.  She denies any fevers or chills.  She denies any weight loss but reports occasional poor appetite.  She reports that her last episode of bronchitis was 2 months ago but it self resolved.  No hospitalizations for COPD exacerbations.  She was prescribed Trelegy by her primary and takes it every day.  She also uses albuterol  rescue as needed.     Past Medical History:  Diagnosis Date   CAD (coronary artery disease) 10/24/2011   COPD, moderate (HCC) 06/05/2015   Essential hypertension 02/21/2011   Gastroesophageal reflux disease without esophagitis 07/23/2012   High cholesterol    Hyperlipidemia 03/25/2023   Hypertension    PAD (peripheral artery disease) (HCC) 03/25/2023   Peripheral neuropathy 03/25/2023   Restless leg syndrome 03/25/2023     History reviewed. No pertinent family history.   Social History   Socioeconomic History   Marital status: Divorced    Spouse name: Not on file   Number of children: Not on file   Years of education: Not on file   Highest education level: Not on file  Occupational History   Not on file  Tobacco Use   Smoking status: Every Day    Types: Cigarettes   Smokeless tobacco: Never  Vaping Use   Vaping status: Never Used  Substance and Sexual Activity   Alcohol  use: Not Currently   Drug use: Never   Sexual activity: Not on file  Other Topics Concern   Not on file  Social History Narrative   Not on file   Social Drivers of  Health   Financial Resource Strain: Low Risk  (06/05/2022)   Received [facility]   Overall Financial Resource Strain (CARDIA)    Difficulty of Paying Living Expenses: Not very hard  Recent Concern: Financial Resource Strain - High Risk (05/27/2022)   Received from [facility]   Overall Financial Resource Strain (CARDIA)    Difficulty of Paying Living Expenses: Very hard  Food Insecurity: No Food Insecurity (03/25/2023)   Hunger Vital Sign    Worried About Running Out of Food in the Last Year: Never true    Ran Out of Food in the Last Year: Never true  Transportation Needs: No Transportation Needs (03/25/2023)   PRAPARE - Administrator, Civil Service (Medical): No    Lack of Transportation (Non-Medical): No  Physical Activity: Unknown (11/25/2021)   [facility] from [facility]   Exercise Vital Sign    On average, how many days per week do you engage in moderate to strenuous exercise (like a brisk walk)?: 0 days    Minutes of Exercise per Session: Not on file  Recent Concern: Physical Activity - Inactive (11/25/2021)   Received from [facility]   Exercise Vital Sign    Days of Exercise per Week: 0 days    Minutes of Exercise per Session: 10 min  Stress: No Stress Concern Present (06/05/2022)   Received from [facility]  Occupational Health - Occupational Stress Questionnaire    Feeling of Stress : Not at all  Social Connections: Unknown (11/26/2022)   Received from [facility]   Social Network    Social Network: Not on file  Intimate Partner Violence: Not At Risk (03/25/2023)   Humiliation, Afraid, Rape, and Kick questionnaire    Fear of Current or Ex-Partner: No    Emotionally Abused: No    Physically Abused: No    Sexually Abused: No     Allergies  Allergen Reactions   Azithromycin Itching   Codeine Nausea Only and Other (See Comments)    Causes stomach pain     Penicillins Other (See Comments) and Rash    Tolerates  cephalosporins Cause stomach pain    Aspirin     Can Take 81 mg but not 325 mg. GI Upset (intolerance) Nausea Only   Tramadol Nausea Only   Cyclobenzaprine Nausea Only   Prednisone Nausea Only and Other (See Comments)    Low dose is ok Can only take 10mg  of prednisone- gives her shakes       Outpatient Medications Prior to Visit  Medication Sig Dispense Refill   clopidogrel  (PLAVIX ) 75 MG tablet Take 75 mg by mouth every morning.     FLUoxetine (PROZAC) 20 MG capsule Take 20 mg by mouth daily.     LINZESS  72 MCG capsule Take 72 mcg by mouth every morning.     lisinopril  (ZESTRIL ) 10 MG tablet Take 10 mg by mouth daily.     pregabalin (LYRICA) 25 MG capsule Take 25 mg by mouth 3 (three) times daily.     rOPINIRole (REQUIP) 1 MG tablet Take 1 mg by mouth at bedtime.     rosuvastatin  (CRESTOR ) 20 MG tablet Take 20 mg by mouth at bedtime.     senna (SENOKOT) 8.6 MG TABS tablet Take 1 tablet (8.6 mg total) by mouth 2 (two) times daily. 120 tablet 0   sertraline  (ZOLOFT ) 50 MG tablet Take 50 mg by mouth every morning.     tiZANidine (ZANAFLEX) 4 MG tablet Take 4 mg by mouth at bedtime.     XARELTO  2.5 MG TABS tablet Take 2.5 mg by mouth in the morning.     albuterol  (PROVENTIL ) (2.5 MG/3ML) 0.083% nebulizer solution Take 2.5 mg by nebulization every 6 (six) hours as needed for wheezing or shortness of breath.     TRELEGY ELLIPTA  100-62.5-25 MCG/ACT AEPB Inhale 1 puff into the lungs daily as needed (sob/wheezing).     No facility-administered medications prior to visit.    ROS Reviewed all systems and reported negative except as above     Objective:   Vitals:   01/17/24 1349  BP: (!) 161/89  Pulse: 70  Temp: 98.5 F (36.9 C)  TempSrc: Temporal  SpO2: 97%  Weight: 141 lb 3.2 oz (64 kg)  Height: 5' 2 (1.575 m)    Physical Exam General: Elderly female not in acute distress Chest: Clear to auscultation bilaterally Heart: Regular rate and rhythm, normal S1, normal  S2 Extremities: No lower extremity edema Neuro: Grossly intact    CBC    Component Value Date/Time   WBC 5.9 03/28/2023 0321   RBC 2.67 (L) 03/28/2023 0321   HGB 7.7 (L) 03/28/2023 0321   HCT 25.6 (L) 03/28/2023 0321   PLT 175 03/28/2023 0321   MCV 95.9 03/28/2023 0321   MCH 28.8 03/28/2023 0321   MCHC 30.1 03/28/2023 0321   RDW 13.2 03/28/2023 0321  LYMPHSABS 4.8 (H) 05/25/2022 1429   MONOABS 1.1 (H) 05/25/2022 1429   EOSABS 0.2 05/25/2022 1429   BASOSABS 0.1 05/25/2022 1429     Chest imaging:  PFT:     No data to display             Assessment & Plan:   Assessment & Plan Smoker Patient counseled about quitting smoking.  Reviewed CT chest with her and showed her smoking-related lung damage.  Patient is interested in quitting smoking.  Prescription for nicotine  patch and gum prescribed.  She would like to avoid Wellbutrin or Chantix at this time. Centrilobular emphysema (HCC) Evidence of centrilobular emphysema on CT chest.  Patient has had no exacerbations where she ends up in the hospital.  Currently on Trelegy and albuterol  rescue.  She also has a nebulizer machine.  Plan to obtain full PFTs.  Can consider escalation of treatment for her COPD if she continues to be symptomatic on her next follow-up visit.  Patient is not interested in vaccines at this time.  We discussed the flu shot and the pneumonia shot and she would like to hold off on both.   Can discuss pulmonary rehab in the future Lung nodule He is noted to have multiple lung nodules the largest is an 8 mm left upper lobe nodule that we will need follow-up.  A CT scan was ordered for follow-up to be done within the next month.  Patient is in agreement to obtain follow-up CT for further evaluation of this lung nodule.  Discussed with patient suspicion for malignancy as our main concern for this nodule.  Orders Placed This Encounter  Procedures   CT Chest Wo Contrast    Standing Status:   Future     Expected Date:   02/17/2024    Expiration Date:   01/16/2025    Preferred imaging location?:   [facility]   Pulmonary function test    Standing Status:   Future    Expected Date:   01/17/2024    Expiration Date:   01/16/2025    Where should this test be performed?:   Outpatient Pulmonary    What type of PFT is being ordered?:   Full PFT        Zola Herter, MD  Pulmonary & Critical Care Office: [phone]

## 2024-01-19 ENCOUNTER — Telehealth: Payer: Self-pay

## 2024-01-19 NOTE — Telephone Encounter (Signed)
 Copied from CRM [id number]. Topic: Clinical - Prescription Issue >> Jan 18, 2024  4:57 PM Rilla B wrote: Reason for CRM: Dr Zaida sent an order for patches and gum to the pharmacy.  Patient states insurance does not cover these items.  What other options are available to patient.  Please call patient at [phone].

## 2024-01-19 NOTE — Telephone Encounter (Signed)
 Dr. Zaida please advise

## 2024-01-24 NOTE — Telephone Encounter (Signed)
 [facility] numbers in patients chart, phone number is invalid and would not ring.

## 2024-02-15 IMAGING — CR DG CHEST 2V
2 series · 2 of 2 positions shown · non-contrast
Comparison: 01/03/2010

CLINICAL DATA: Shortness of breath, dizziness

EXAM:
CHEST - 2 VIEW

[chest pa]
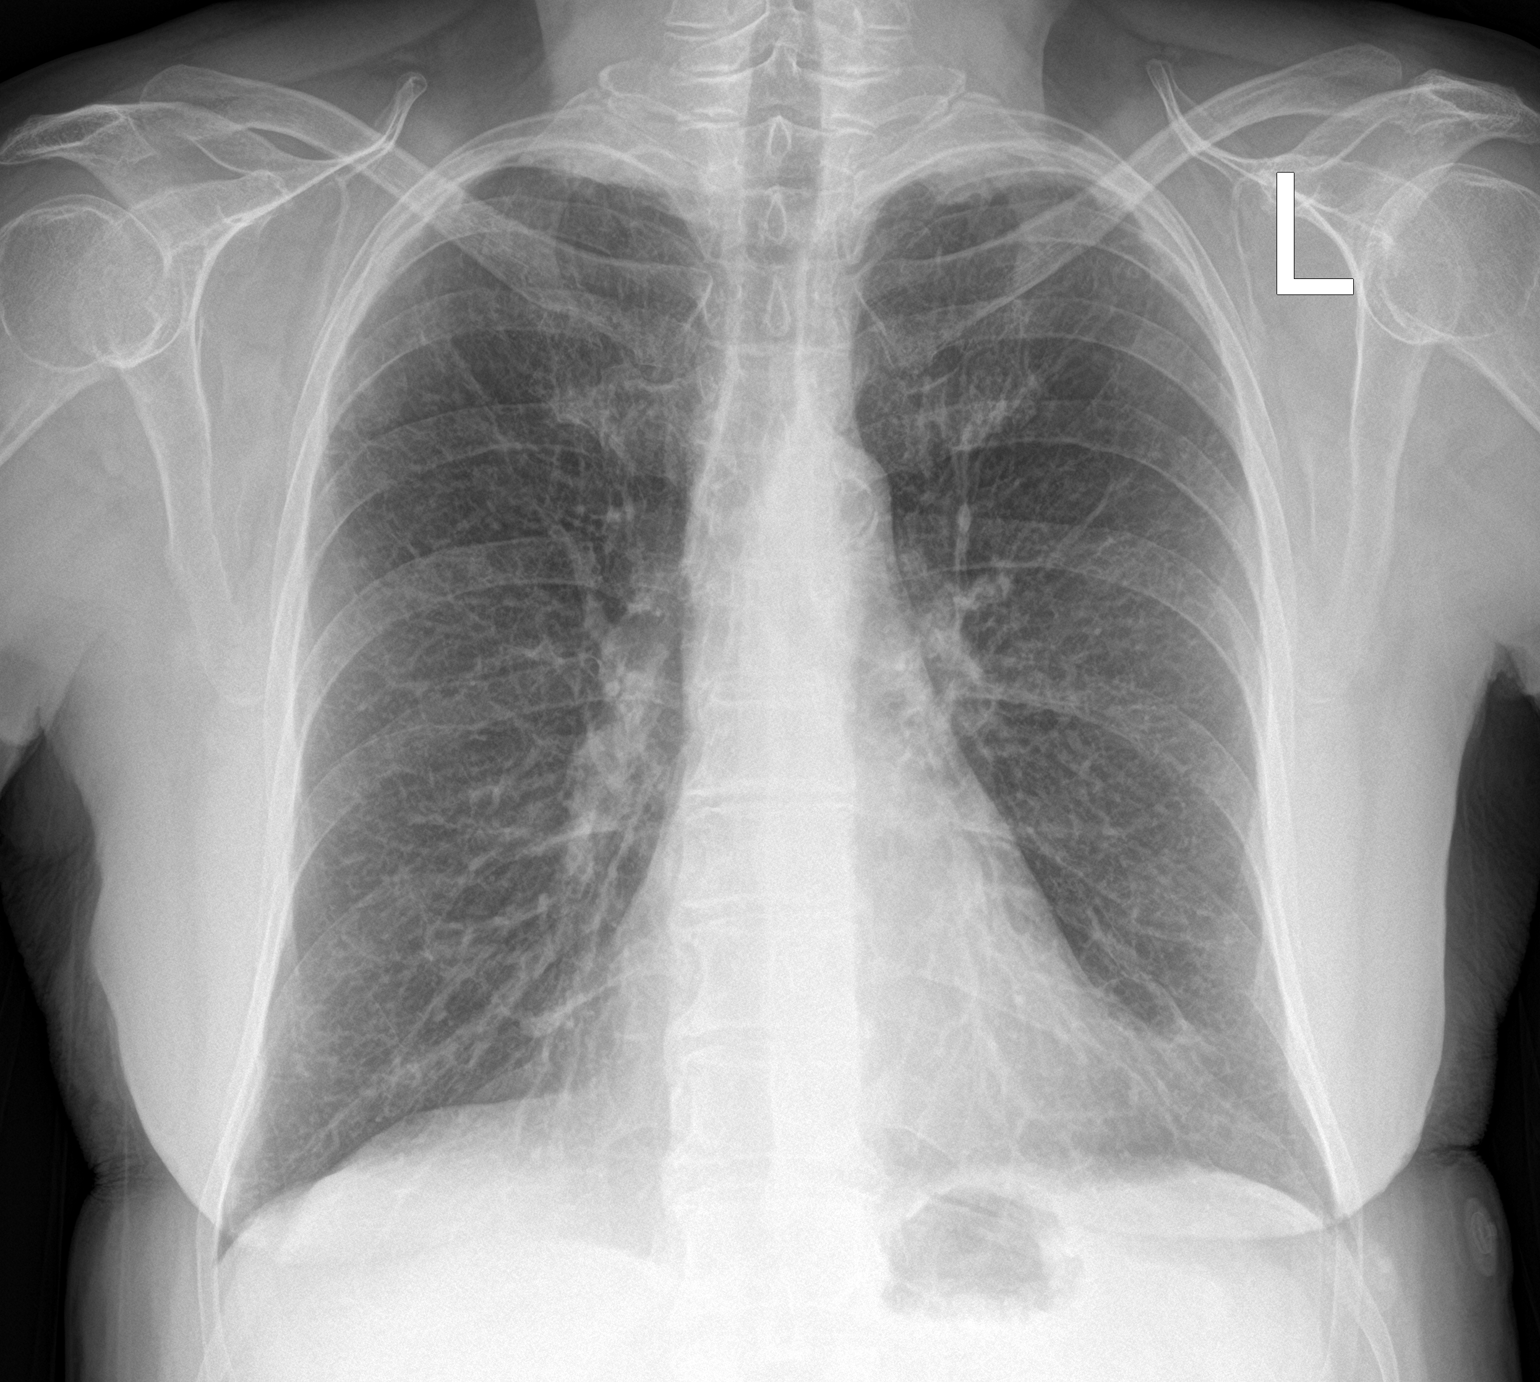

[chest lat]
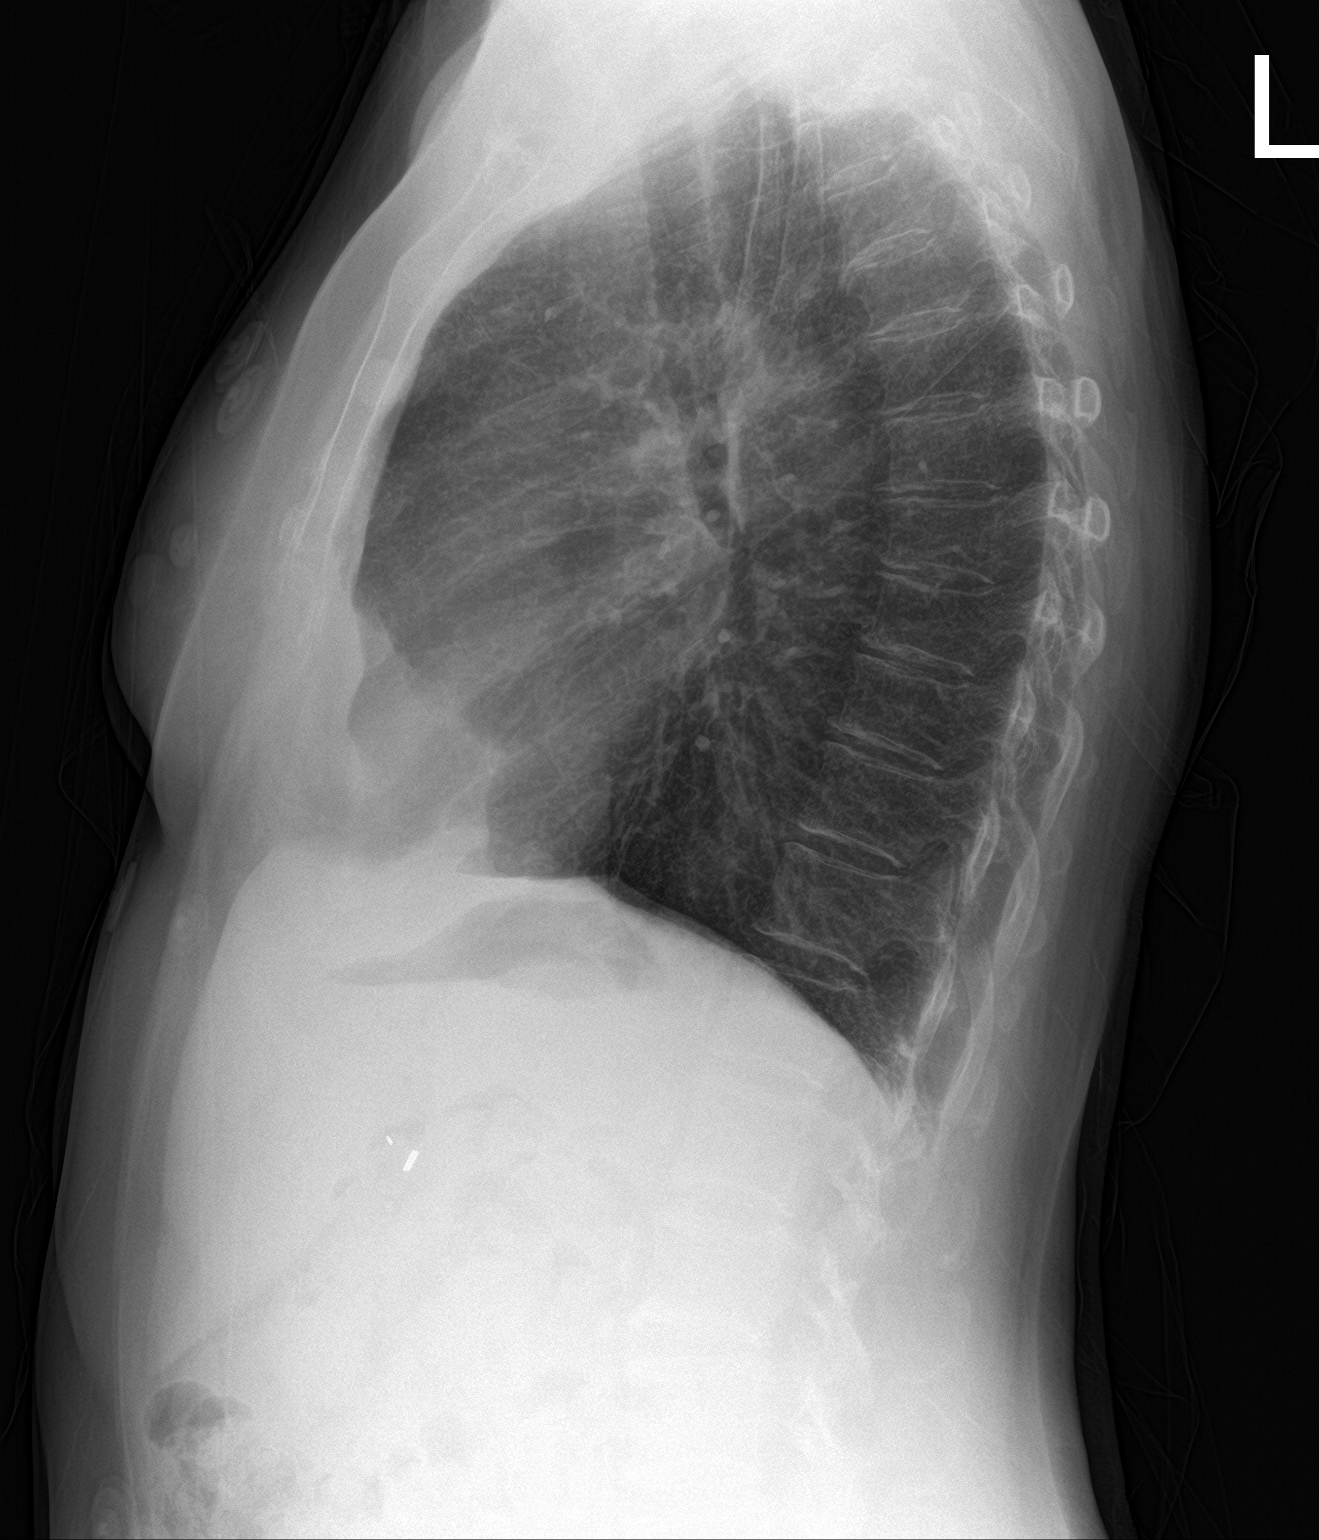

[2 of 2 positions shown; findings below may reference images not displayed]

FINDINGS: Lungs are clear.  No pleural effusion or pneumothorax.

The heart is normal in size.

Cholecystectomy clips.
IMPRESSION: Normal chest radiographs.

## 2024-03-01 ENCOUNTER — Encounter (HOSPITAL_COMMUNITY)

## 2024-03-01 ENCOUNTER — Ambulatory Visit: Admitting: Vascular Surgery

## 2024-03-08 ENCOUNTER — Ambulatory Visit (HOSPITAL_COMMUNITY)
Admission: RE | Admit: 2024-03-08 | Discharge: 2024-03-08 | Disposition: A | Discharge status: Home / Self Care | Source: Ambulatory Visit | Attending: Vascular Surgery | Admitting: Vascular Surgery

## 2024-03-08 ENCOUNTER — Ambulatory Visit (HOSPITAL_BASED_OUTPATIENT_CLINIC_OR_DEPARTMENT_OTHER)
Admission: RE | Admit: 2024-03-08 | Discharge: 2024-03-08 | Disposition: A | Discharge status: Home / Self Care | Source: Ambulatory Visit | Attending: Vascular Surgery | Admitting: Vascular Surgery

## 2024-03-08 ENCOUNTER — Ambulatory Visit: Admitting: Vascular Surgery

## 2024-03-08 DIAGNOSIS — I739 Peripheral vascular disease, unspecified: Secondary | ICD-10-CM

## 2024-03-08 LAB — VAS US ABI WITH/WO TBI
Left ABI: 1.17
Right ABI: 1.01

## 2024-03-12 NOTE — Progress Notes (Unsigned)
 Patient ID: Rachel Ewing, female   DOB: [birth year], 73 y.o.   MRN: [id number]  Reason for Consult: No chief complaint on file.   Referred by No ref. provider found  Subjective:     HPI  Rachel Ewing is a 73 y.o. female with history of peripheral arterial disease who presents for follow up. In July 2023 she underwent complete endovascular reconstruction of the aorta bifurcation.  In September it sounds like she had a left femoral thrombectomy.  And then in January she underwent what sounds like explant and aortobifemoral bypass.  She spent some time in the ICU during that hospital stay but is now recovered.    *** She denies claudication, rest pain or nonhealing wounds. She is compliant with aspirin, plavix , Xarelto (2.5) and Crestor   Past Medical History:  Diagnosis Date   CAD (coronary artery disease) 10/24/2011   COPD, moderate (HCC) 06/05/2015   Essential hypertension 02/21/2011   Gastroesophageal reflux disease without esophagitis 07/23/2012   High cholesterol    Hyperlipidemia 03/25/2023   Hypertension    PAD (peripheral artery disease) 03/25/2023   Peripheral neuropathy 03/25/2023   Restless leg syndrome 03/25/2023   No family history on file. Past Surgical History:  Procedure Laterality Date   AORTOGRAM     TOTAL HIP ARTHROPLASTY Left 03/26/2023   Procedure: TOTAL HIP ARTHROPLASTY ANTERIOR APPROACH;  Surgeon: Fidel Rogue, MD;  Location: [facility];  Service: Orthopedics;  Laterality: Left;    Short Social History:  Social History   Tobacco Use   Smoking status: Every Day    Types: Cigarettes   Smokeless tobacco: Never  Substance Use Topics   Alcohol  use: Not Currently    Allergies  Allergen Reactions   Azithromycin Itching   Codeine Nausea Only and Other (See Comments)    Causes stomach pain     Penicillins Other (See Comments) and Rash    Tolerates cephalosporins Cause stomach pain    Aspirin     Can Take 81 mg but not 325 mg. GI Upset (intolerance)  Nausea Only   Tramadol Nausea Only   Cyclobenzaprine Nausea Only   Prednisone Nausea Only and Other (See Comments)    Low dose is ok Can only take 10mg  of prednisone- gives her shakes      Current Outpatient Medications  Medication Sig Dispense Refill   albuterol  (PROVENTIL ) (2.5 MG/3ML) 0.083% nebulizer solution Take 3 mLs (2.5 mg total) by nebulization every 6 (six) hours as needed for wheezing or shortness of breath. 75 mL 4   albuterol  (VENTOLIN  HFA) 108 (90 Base) MCG/ACT inhaler Inhale 2 puffs into the lungs every 6 (six) hours as needed for wheezing or shortness of breath. 8 g 6   clopidogrel  (PLAVIX ) 75 MG tablet Take 75 mg by mouth every morning.     FLUoxetine (PROZAC) 20 MG capsule Take 20 mg by mouth daily.     LINZESS  72 MCG capsule Take 72 mcg by mouth every morning.     lisinopril  (ZESTRIL ) 10 MG tablet Take 10 mg by mouth daily.     nicotine  (NICODERM CQ  - DOSED IN MG/24 HOURS) 21 mg/24hr patch Place 1 patch (21 mg total) onto the skin daily. 28 patch 0   nicotine  polacrilex (NICORETTE ) 4 MG gum Take 1 each (4 mg total) by mouth as needed for smoking cessation. 100 tablet 0   pregabalin (LYRICA) 25 MG capsule Take 25 mg by mouth 3 (three) times daily.     rOPINIRole (REQUIP) 1 MG tablet Take  1 mg by mouth at bedtime.     rosuvastatin  (CRESTOR ) 20 MG tablet Take 20 mg by mouth at bedtime.     senna (SENOKOT) 8.6 MG TABS tablet Take 1 tablet (8.6 mg total) by mouth 2 (two) times daily. 120 tablet 0   sertraline  (ZOLOFT ) 50 MG tablet Take 50 mg by mouth every morning.     tiZANidine (ZANAFLEX) 4 MG tablet Take 4 mg by mouth at bedtime.     TRELEGY ELLIPTA  100-62.5-25 MCG/ACT AEPB Inhale 1 puff into the lungs daily as needed (sob/wheezing). 60 each 6   XARELTO  2.5 MG TABS tablet Take 2.5 mg by mouth in the morning.     No current facility-administered medications for this visit.    REVIEW OF SYSTEMS   All other systems were reviewed and are negative     Objective:   Objective   There were no vitals filed for this visit.  There is no height or weight on file to calculate BMI.  Physical Exam General: no acute distress Cardiac: hemodynamically stable Pulm: normal work of breathing Neuro: alert, no focal deficit Extremities: no edema, cyanosis or wounds Vascular:   Right: Palpable DP, PT  Left: Palpable DP, PT   Data: ABI ***  Left groin duplex ***      Assessment/Plan:     Rachel Ewing is a 73 y.o. female with PAD and multiple previous iliac interventions. Now with an aortobifemoral bypass that was performed in Jan 2024. She was seen 6 months ago and noted to have palpable pulses with an ABI of 0.9. She was having numbness at that time. She continues to have numbness and again I explained that this is related to permanent nerve and muscle damage from her episodes of ischemia.  CTA with widely patent ABF with ectasia of the left femoral anastomosis, 1.3 cm ABI stable, 1.0 bilaterally   Plan for follow up in 12 months with ABI and L groin duplex to continue surveillance of L CFA anastomotic ectasia.   Continue aspirin, 2.5 xaralto and statin     Norman GORMAN Serve MD [facility]

## 2024-03-13 ENCOUNTER — Ambulatory Visit: Attending: Vascular Surgery | Admitting: Vascular Surgery

## 2024-03-13 ENCOUNTER — Encounter: Payer: Self-pay | Admitting: Vascular Surgery

## 2024-03-13 VITALS — BP 130/79 | HR 80 | Temp 98.6°F | Resp 20 | Ht 62.0 in | Wt 142.1 lb

## 2024-03-13 DIAGNOSIS — I739 Peripheral vascular disease, unspecified: Secondary | ICD-10-CM | POA: Diagnosis not present

## 2024-03-15 ENCOUNTER — Ambulatory Visit (HOSPITAL_COMMUNITY)
Admission: RE | Admit: 2024-03-15 | Discharge: 2024-03-15 | Disposition: A | Discharge status: Home / Self Care | Source: Ambulatory Visit

## 2024-03-15 DIAGNOSIS — J432 Centrilobular emphysema: Secondary | ICD-10-CM | POA: Insufficient documentation

## 2024-03-15 DIAGNOSIS — F17200 Nicotine dependence, unspecified, uncomplicated: Secondary | ICD-10-CM | POA: Diagnosis present

## 2024-03-15 DIAGNOSIS — R911 Solitary pulmonary nodule: Secondary | ICD-10-CM | POA: Insufficient documentation

## 2024-03-18 ENCOUNTER — Ambulatory Visit: Payer: Self-pay

## 2024-03-18 DIAGNOSIS — R911 Solitary pulmonary nodule: Secondary | ICD-10-CM

## 2024-03-18 NOTE — Progress Notes (Signed)
 Called and spoke to pt - advised of Chest CT results per Dr. Zaida. Pt verbalized understanding, NFN.

## 2024-04-24 ENCOUNTER — Ambulatory Visit

## 2024-04-24 VITALS — BP 116/70 | HR 87 | Temp 98.0°F | Ht 62.25 in | Wt 143.4 lb

## 2024-04-24 DIAGNOSIS — J432 Centrilobular emphysema: Secondary | ICD-10-CM | POA: Diagnosis not present

## 2024-04-24 DIAGNOSIS — F17200 Nicotine dependence, unspecified, uncomplicated: Secondary | ICD-10-CM

## 2024-04-24 DIAGNOSIS — R911 Solitary pulmonary nodule: Secondary | ICD-10-CM | POA: Diagnosis not present

## 2024-04-24 DIAGNOSIS — F17210 Nicotine dependence, cigarettes, uncomplicated: Secondary | ICD-10-CM | POA: Diagnosis not present

## 2024-04-24 LAB — PULMONARY FUNCTION TEST
DL/VA % pred: 83 %
DL/VA: 3.49 ml/min/mmHg/L
DLCO unc % pred: 60 %
DLCO unc: 10.98 ml/min/mmHg
FEF 25-75 Pre: 0.62 L/s
FEF2575-%Pred-Pre: 35 %
FEV1-%Pred-Pre: 57 %
FEV1-Pre: 1.2 L
FEV1FVC-%Pred-Pre: 86 %
FEV6-%Pred-Pre: 69 %
FEV6-Pre: 1.82 L
FEV6FVC-%Pred-Pre: 104 %
FVC-%Pred-Pre: 66 %
FVC-Pre: 1.83 L
Pre FEV1/FVC ratio: 65 %
Pre FEV6/FVC Ratio: 100 %
RV % pred: 47 %
RV: 1 L
TLC % pred: 77 %
TLC: 3.73 L

## 2024-04-24 NOTE — Patient Instructions (Signed)
 Full pft w/o post performed. Patient used neb treatment less than 30 minutes prior to appointment. No post needed per Dr. Donzetta.

## 2024-04-24 NOTE — Patient Instructions (Signed)
  VISIT SUMMARY: Today, we discussed your ongoing management of emphysema and smoking cessation efforts. We reviewed your recent exacerbation and current medications, and we talked about strategies to help you quit smoking. We also addressed the need for follow-up on your lung nodules.  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 You should continue using your Trelegy Ellipta  inhaler daily and use Albuterol  nebulization as needed. We emphasized the importance of quitting smoking to prevent further progression of the disease and discussed pulmonary rehabilitation as a future option. A repeat CT scan has been ordered for April 26th, 2026, to monitor your lung nodules.  -NICOTINE  DEPENDENCE: This refers to an addiction to nicotine , typically from smoking. We recommended using 21 mg nicotine  patches and gum to help you quit smoking. We also provided the [place]  quit hotline number for additional support.  -LUNG NODULE: These are small growths in the lungs that need to be monitored for any changes. A repeat CT scan has been ordered for April 26th, 2026, to keep an eye on these nodules.  INSTRUCTIONS: Please continue using your Trelegy Ellipta  inhaler daily and Albuterol  nebulization as needed. Use 21 mg nicotine  patches and gum to help quit smoking, and consider calling the [place]  quit hotline for additional support. We will follow up with a repeat CT scan on April 26th, 2026, to monitor your lung nodules.  Call 1-800-quit-NOW [phone])[phone]) to get free nicotine  replacement and counseling from the state of

## 2024-04-24 NOTE — Progress Notes (Signed)
 Subjective:   PATIENT ID: Rachel Ewing GENDER: female DOB: [birth year], MRN: [id number]   HPI Discussed the use of AI scribe software for clinical note transcription with the patient, who gave verbal consent to proceed.  History of Present Illness Rachel Ewing is a 73 year old female with emphysema who presents for follow-up of her pulmonary function and smoking cessation.   She has a a past medical history of CAD, active tobacco use (at least 55-pack-year), COPD on Trelegy (no PFTs on file), PAD (status post bypass) .   She has a history of emphysema and has undergone pulmonary function tests. She experiences exacerbations of her COPD symptoms when she contracts viruses, leading to increased shortness of breath and wheezing. A recent illness around Thanksgiving required a visit to urgent care, where she received prednisone and doxycycline.  She is currently using a Trelegy inhaler once daily, a nebulizer machine, and albuterol  as a rescue inhaler. She uses her nebulizer in the morning due to difficulty breathing but did not use her Trelegy inhaler at that time.  She has attempted to quit smoking using nicotine  patches and gum, but her insurance does not cover the patches. She smokes approximately a pack a day. She has not yet utilized the [place]  quit hotline for additional support.  She has a history of nerve damage in her foot following surgery, which causes significant pain when touched. She also mentions needing to see a kidney doctor.  She lives in her own home where smoking is prevalent among family members, including her sister who also smokes.     Past Medical History:  Diagnosis Date   CAD (coronary artery disease) 10/24/2011   COPD, moderate (HCC) 06/05/2015   Essential hypertension 02/21/2011   Gastroesophageal reflux disease without esophagitis 07/23/2012   High cholesterol    Hyperlipidemia 03/25/2023   Hypertension    PAD (peripheral artery disease)  03/25/2023   Peripheral neuropathy 03/25/2023   Restless leg syndrome 03/25/2023     History reviewed. No pertinent family history.   Social History   Socioeconomic History   Marital status: Divorced    Spouse name: Not on file   Number of children: Not on file   Years of education: Not on file   Highest education level: Not on file  Occupational History   Not on file  Tobacco Use   Smoking status: Every Day    Types: Cigarettes   Smokeless tobacco: Never   Tobacco comments:    Patient stated she is still smoking cigarettes daily, but sated she doesn't know exactly how many a day 04/24/2024 (as)  Vaping Use   Vaping status: Never Used  Substance and Sexual Activity   Alcohol  use: Not Currently   Drug use: Never   Sexual activity: Not on file  Other Topics Concern   Not on file  Social History Narrative   Not on file   Social Drivers of Health   Financial Resource Strain: Low Risk  (06/05/2022)   Received from [facility]   Overall Financial Resource Strain (CARDIA)    Difficulty of Paying Living Expenses: Not very hard  Recent Concern: Financial Resource Strain - High Risk (05/27/2022)   Received from [facility]   Overall Financial Resource Strain (CARDIA)    Difficulty of Paying Living Expenses: Very hard  Food Insecurity: No Food Insecurity (03/25/2023)   Hunger Vital Sign    Worried About Running Out of Food in the Last Year: Never true  Ran Out of Food in the Last Year: Never true  Transportation Needs: No Transportation Needs (03/25/2023)   PRAPARE - Administrator, Civil Service (Medical): No    Lack of Transportation (Non-Medical): No  Physical Activity: Unknown (11/25/2021)   Received from [facility]   Exercise Vital Sign    On average, how many days per week do you engage in moderate to strenuous exercise (like a brisk walk)?: 0 days    Minutes of Exercise per Session: Not on file  Recent Concern: Physical Activity - Inactive (11/25/2021)    Received from [facility]   Exercise Vital Sign    Days of Exercise per Week: 0 days    Minutes of Exercise per Session: 10 min  Stress: No Stress Concern Present (06/05/2022)   Received from [facility] - Occupational Stress Questionnaire    Feeling of Stress : Not at all  Social Connections: Socially Isolated (11/25/2021)   Received from [facility]   Social Network    How would you rate your social network (family, work, friends)?: Little participation, lonely and socially isolated  Intimate Partner Violence: Not At Risk (03/25/2023)   Humiliation, Afraid, Rape, and Kick questionnaire    Fear of Current or Ex-Partner: No    Emotionally Abused: No    Physically Abused: No    Sexually Abused: No     Allergies  Allergen Reactions   Azithromycin Itching   Codeine Nausea Only and Other (See Comments)    Causes stomach pain     Penicillins Other (See Comments) and Rash    Tolerates cephalosporins Cause stomach pain    Aspirin     Can Take 81 mg but not 325 mg. GI Upset (intolerance) Nausea Only   Tramadol Nausea Only   Cyclobenzaprine Nausea Only   Prednisone Nausea Only and Other (See Comments)    Low dose is ok Can only take 10mg  of prednisone- gives her shakes       Outpatient Medications Prior to Visit  Medication Sig Dispense Refill   albuterol  (PROVENTIL ) (2.5 MG/3ML) 0.083% nebulizer solution Take 3 mLs (2.5 mg total) by nebulization every 6 (six) hours as needed for wheezing or shortness of breath. 75 mL 4   albuterol  (VENTOLIN  HFA) 108 (90 Base) MCG/ACT inhaler Inhale 2 puffs into the lungs every 6 (six) hours as needed for wheezing or shortness of breath. 8 g 6   clopidogrel  (PLAVIX ) 75 MG tablet Take 75 mg by mouth every morning.     FLUoxetine (PROZAC) 20 MG capsule Take 20 mg by mouth daily.     LINZESS  72 MCG capsule Take 72 mcg by mouth every morning.     lisinopril  (ZESTRIL ) 10 MG tablet Take 10 mg by mouth  daily.     pregabalin (LYRICA) 25 MG capsule Take 25 mg by mouth 3 (three) times daily.     rOPINIRole (REQUIP) 1 MG tablet Take 1 mg by mouth at bedtime.     rosuvastatin  (CRESTOR ) 20 MG tablet Take 20 mg by mouth at bedtime.     senna (SENOKOT) 8.6 MG TABS tablet Take 1 tablet (8.6 mg total) by mouth 2 (two) times daily. 120 tablet 0   sertraline  (ZOLOFT ) 50 MG tablet Take 50 mg by mouth every morning.     tiZANidine (ZANAFLEX) 4 MG tablet Take 4 mg by mouth at bedtime.     TRELEGY ELLIPTA  100-62.5-25 MCG/ACT AEPB Inhale 1 puff into the  lungs daily as needed (sob/wheezing). 60 each 6   XARELTO  2.5 MG TABS tablet Take 2.5 mg by mouth in the morning.     nicotine  (NICODERM CQ  - DOSED IN MG/24 HOURS) 21 mg/24hr patch Place 1 patch (21 mg total) onto the skin daily. (Patient not taking: Reported on 04/24/2024) 28 patch 0   nicotine  polacrilex (NICORETTE ) 4 MG gum Take 1 each (4 mg total) by mouth as needed for smoking cessation. (Patient not taking: Reported on 04/24/2024) 100 tablet 0   No facility-administered medications prior to visit.    ROS Reviewed all systems and reported negative except as above     Objective:   Vitals:   04/24/24 0930  BP: 116/70  Pulse: 87  Temp: 98 F (36.7 C)  TempSrc: Oral  SpO2: 96%  Weight: 143 lb 6.4 oz (65 kg)  Height: 5' 2.25 (1.581 m)    Physical Exam Physical Exam GENERAL: Appropriate to age, no acute distress. HEAD EYES EARS NOSE THROAT: Moist mucous membranes, atraumatic, normocephalic. CHEST: Clear to auscultation bilaterally, no wheezing, no crackles, no rales. CARDIAC: Regular rate and rhythm, normal S1, normal S2, no murmurs, no rubs, no gallops. ABDOMEN: Soft, nontender. NEUROLOGICAL: Motor and sensation grossly intact, alert and oriented times X 3. EXTREMITIES: Warm, well perfused, no edema.     CBC    Component Value Date/Time   WBC 5.9 03/28/2023 0321   RBC 2.67 (L) 03/28/2023 0321   HGB 7.7 (L) 03/28/2023 0321   HCT  25.6 (L) 03/28/2023 0321   PLT 175 03/28/2023 0321   MCV 95.9 03/28/2023 0321   MCH 28.8 03/28/2023 0321   MCHC 30.1 03/28/2023 0321   RDW 13.2 03/28/2023 0321   LYMPHSABS 4.8 (H) 05/25/2022 1429   MONOABS 1.1 (H) 05/25/2022 1429   EOSABS 0.2 05/25/2022 1429   BASOSABS 0.1 05/25/2022 1429     Chest imaging: I reviewed her CT chest which shows moderate emphysema, new 5 mm nodule in the right middle lobe and a stable 7 mm nodule in the left lower lobe.  A follow-up CT is ordered in 6 months.  PFT:    Latest Ref Rng & Units 04/24/2024    8:46 AM  PFT Results  FVC-Pre L 1.83   FVC-Predicted Pre % 66   Pre FEV1/FVC % % 65   FEV1-Pre L 1.20   FEV1-Predicted Pre % 57   DLCO uncorrected ml/min/mmHg 10.98   DLCO UNC% % 60   DLVA Predicted % 83   TLC L 3.73   TLC % Predicted % 77   RV % Predicted % 47        Assessment & Plan:   Assessment and Plan Assessment & Plan Chronic obstructive pulmonary disease with centrilobular emphysema Moderate to severe obstruction, GOLD stage 3E, with recent exacerbation. PFTs show 56% FEV1 predicted.  Emphasized smoking cessation to prevent progression and potential oxygen therapy. - Continue Trelegy Ellipta  100-62.5-25 MCG/ACT inhalation daily. - Use Albuterol  nebulization as needed for rescue. - Encouraged smoking cessation with nicotine  patches and gum. - Discussed pulmonary rehabilitation as a future option post-smoking cessation. - Ordered repeat CT scan for April 26th, 2026, to monitor lung nodules.  Nicotine  dependence Long-standing nicotine  dependence with one pack per day. Previous cessation attempts with patches and Chantix failed due to side effects and insurance issues. Emphasized willpower and cost-effectiveness of nicotine  patches and gum. - Recommended 21 mg nicotine  patches and gum for smoking cessation. - Provided [place]  quit hotline number for additional  support. - Discussed potential use of Wellbutrin as an  alternative to Chantix.  Smoking/Tobacco Cessation Counseling Zemira Zehring is a current user of tobacco or nicotine  products. She is considering quitting at this time. Counseling provided today addressed the risks of continued use and the benefits of cessation. Discussed tobacco/nicotine  use history, readiness to quit, and evidence-based treatment options including behavioral strategies, support resources, and pharmacologic therapies. Provided encouragement and educational materials on steps and resources to quit smoking. Patient questions were addressed, and follow-up recommended for continued support. Total time spent on counseling: 7 minutes.    Lung nodule Presence of lung nodules requiring monitoring. Previous CT scan showed nodules needing follow-up. - Ordered repeat CT scan for April 26th, 2026, to monitor lung nodules.        Zola Herter, MD [facility] Office: [phone]

## 2024-04-24 NOTE — Progress Notes (Signed)
 Full pft w/o post performed. Patient used neb treatment less than 30 minutes prior to appointment. No post needed per Dr. Donzetta.
